# Patient Record
Sex: Female | Born: 1950 | Race: White | Hispanic: No | Marital: Married | Smoking: Never smoker
Health system: Southern US, Community
[De-identification: ages and names within clinical notes are randomized; demographics above are authoritative.]

## PROBLEM LIST (undated history)

## (undated) DIAGNOSIS — G5620 Lesion of ulnar nerve, unspecified upper limb: Secondary | ICD-10-CM

## (undated) DIAGNOSIS — J302 Other seasonal allergic rhinitis: Secondary | ICD-10-CM

## (undated) DIAGNOSIS — F419 Anxiety disorder, unspecified: Secondary | ICD-10-CM

## (undated) DIAGNOSIS — Q433 Congenital malformations of intestinal fixation: Secondary | ICD-10-CM

## (undated) DIAGNOSIS — R001 Bradycardia, unspecified: Secondary | ICD-10-CM

## (undated) DIAGNOSIS — M1990 Unspecified osteoarthritis, unspecified site: Secondary | ICD-10-CM

## (undated) DIAGNOSIS — K5790 Diverticulosis of intestine, part unspecified, without perforation or abscess without bleeding: Secondary | ICD-10-CM

## (undated) DIAGNOSIS — K5792 Diverticulitis of intestine, part unspecified, without perforation or abscess without bleeding: Secondary | ICD-10-CM

## (undated) DIAGNOSIS — R002 Palpitations: Secondary | ICD-10-CM

## (undated) DIAGNOSIS — B019 Varicella without complication: Secondary | ICD-10-CM

## (undated) DIAGNOSIS — E785 Hyperlipidemia, unspecified: Secondary | ICD-10-CM

## (undated) DIAGNOSIS — E079 Disorder of thyroid, unspecified: Secondary | ICD-10-CM

## (undated) DIAGNOSIS — I10 Essential (primary) hypertension: Secondary | ICD-10-CM

## (undated) DIAGNOSIS — M25512 Pain in left shoulder: Secondary | ICD-10-CM

## (undated) DIAGNOSIS — Z136 Encounter for screening for cardiovascular disorders: Secondary | ICD-10-CM

## (undated) DIAGNOSIS — J45909 Unspecified asthma, uncomplicated: Secondary | ICD-10-CM

## (undated) HISTORY — PX: KNEE SURGERY: SHX244

## (undated) HISTORY — PX: THYROIDECTOMY, PARTIAL: SHX18

## (undated) HISTORY — PX: OTHER SURGICAL HISTORY: SHX169

## (undated) HISTORY — DX: Varicella without complication: B01.9

## (undated) HISTORY — PX: WRIST SURGERY: SHX841

## (undated) HISTORY — DX: Anxiety disorder, unspecified: F41.9

## (undated) HISTORY — DX: Palpitations: R00.2

## (undated) HISTORY — PX: ABDOMINAL HYSTERECTOMY: SHX81

## (undated) HISTORY — PX: BREAST BIOPSY: SHX20

## (undated) HISTORY — PX: WISDOM TOOTH EXTRACTION: SHX21

## (undated) HISTORY — DX: Encounter for screening for cardiovascular disorders: Z13.6

## (undated) HISTORY — DX: Diverticulosis of intestine, part unspecified, without perforation or abscess without bleeding: K57.90

## (undated) HISTORY — DX: Other seasonal allergic rhinitis: J30.2

## (undated) HISTORY — DX: Diverticulitis of intestine, part unspecified, without perforation or abscess without bleeding: K57.92

## (undated) HISTORY — DX: Congenital malformations of intestinal fixation: Q43.3

## (undated) HISTORY — DX: Lesion of ulnar nerve, unspecified upper limb: G56.20

## (undated) HISTORY — PX: TONSILLECTOMY: SUR1361

## (undated) HISTORY — DX: Bradycardia, unspecified: R00.1

## (undated) HISTORY — PX: SHOULDER SURGERY: SHX246

---

## 2005-01-22 ENCOUNTER — Other Ambulatory Visit: Admission: RE | Admit: 2005-01-22 | Discharge: 2005-01-22 | Payer: Self-pay | Admitting: Obstetrics and Gynecology

## 2005-07-29 ENCOUNTER — Ambulatory Visit (HOSPITAL_BASED_OUTPATIENT_CLINIC_OR_DEPARTMENT_OTHER): Admission: RE | Admit: 2005-07-29 | Discharge: 2005-07-29 | Payer: Self-pay | Admitting: Orthopedic Surgery

## 2006-07-23 ENCOUNTER — Encounter: Admission: RE | Admit: 2006-07-23 | Discharge: 2006-07-23 | Payer: Self-pay | Admitting: Family Medicine

## 2012-03-31 DIAGNOSIS — Q433 Congenital malformations of intestinal fixation: Secondary | ICD-10-CM

## 2012-03-31 HISTORY — DX: Congenital malformations of intestinal fixation: Q43.3

## 2012-10-28 LAB — HM COLONOSCOPY

## 2012-12-09 ENCOUNTER — Encounter (HOSPITAL_BASED_OUTPATIENT_CLINIC_OR_DEPARTMENT_OTHER): Payer: Self-pay

## 2012-12-09 ENCOUNTER — Emergency Department (HOSPITAL_BASED_OUTPATIENT_CLINIC_OR_DEPARTMENT_OTHER): Payer: 59

## 2012-12-09 ENCOUNTER — Emergency Department (HOSPITAL_BASED_OUTPATIENT_CLINIC_OR_DEPARTMENT_OTHER)
Admission: EM | Admit: 2012-12-09 | Discharge: 2012-12-09 | Disposition: A | Discharge status: Home / Self Care | Payer: 59 | Attending: Emergency Medicine | Admitting: Emergency Medicine

## 2012-12-09 DIAGNOSIS — R0789 Other chest pain: Secondary | ICD-10-CM | POA: Insufficient documentation

## 2012-12-09 DIAGNOSIS — F411 Generalized anxiety disorder: Secondary | ICD-10-CM | POA: Insufficient documentation

## 2012-12-09 DIAGNOSIS — E785 Hyperlipidemia, unspecified: Secondary | ICD-10-CM | POA: Insufficient documentation

## 2012-12-09 DIAGNOSIS — R002 Palpitations: Secondary | ICD-10-CM | POA: Insufficient documentation

## 2012-12-09 DIAGNOSIS — Z79899 Other long term (current) drug therapy: Secondary | ICD-10-CM | POA: Insufficient documentation

## 2012-12-09 DIAGNOSIS — E039 Hypothyroidism, unspecified: Secondary | ICD-10-CM | POA: Insufficient documentation

## 2012-12-09 HISTORY — DX: Congenital malformations of intestinal fixation: Q43.3

## 2012-12-09 HISTORY — DX: Hyperlipidemia, unspecified: E78.5

## 2012-12-09 HISTORY — DX: Disorder of thyroid, unspecified: E07.9

## 2012-12-09 LAB — COMPREHENSIVE METABOLIC PANEL
AST: 24 U/L (ref 0–37)
BUN: 15 mg/dL (ref 6–23)
CO2: 25 mEq/L (ref 19–32)
Calcium: 9.9 mg/dL (ref 8.4–10.5)
Chloride: 105 mEq/L (ref 96–112)
Creatinine, Ser: 0.9 mg/dL (ref 0.50–1.10)
GFR calc Af Amer: 79 mL/min — ABNORMAL LOW (ref >90)
GFR calc non Af Amer: 68 mL/min — ABNORMAL LOW (ref >90)
Glucose, Bld: 103 mg/dL — ABNORMAL HIGH (ref 70–99)
Total Bilirubin: 1.1 mg/dL (ref 0.3–1.2)

## 2012-12-09 LAB — CBC WITH DIFFERENTIAL/PLATELET
Basophils Absolute: 0 10*3/uL (ref 0.0–0.1)
Eosinophils Relative: 1 % (ref 0–5)
HCT: 43.4 % (ref 36.0–46.0)
Hemoglobin: 14.9 g/dL (ref 12.0–15.0)
Lymphocytes Relative: 23 % (ref 12–46)
Lymphs Abs: 1.5 10*3/uL (ref 0.7–4.0)
MCV: 87.9 fL (ref 78.0–100.0)
Monocytes Absolute: 0.5 10*3/uL (ref 0.1–1.0)
Monocytes Relative: 9 % (ref 3–12)
Neutro Abs: 4.1 10*3/uL (ref 1.7–7.7)
RBC: 4.94 MIL/uL (ref 3.87–5.11)
RDW: 13.4 % (ref 11.5–15.5)
WBC: 6.2 10*3/uL (ref 4.0–10.5)

## 2012-12-09 LAB — TROPONIN I: Troponin I: <0.3 ng/mL (ref <0.30)

## 2012-12-09 MED ORDER — ASPIRIN 325 MG PO TABS
325.0000 mg | ORAL_TABLET | Freq: Once | ORAL | Status: DC
  Filled 2012-12-09: qty 1

## 2012-12-09 MED ORDER — ASPIRIN 81 MG PO CHEW
CHEWABLE_TABLET | ORAL | Status: AC
  Administered 2012-12-09: 81 mg
  Filled 2012-12-09: qty 2

## 2012-12-09 NOTE — ED Notes (Signed)
Patient transported to X-ray 

## 2012-12-09 NOTE — ED Notes (Signed)
Sent to ED for evaluation of anxiety, palpitations, and tachycardic.

## 2012-12-09 NOTE — ED Notes (Signed)
Pt took 164mg  Aspirin PTA

## 2012-12-09 NOTE — ED Provider Notes (Signed)
CSN: [id number]     Arrival date & time 12/09/12  1258 History   First MD Initiated Contact with Patient 12/09/12 1304     Chief Complaint  Patient presents with  . Anxiety  . Palpitations   (Consider location/radiation/quality/duration/timing/severity/associated sxs/prior Treatment) The history is provided by the patient.  Courtney Valenzuela is a 62 y.o. female issue hypothyroidism, hyperlipidemia here presenting with chest pain and palpitations. Start with palpitations while at work around 10:30 AM this morning. Denies being more anxious but she's been very stressed out recently at work and with her personal life. Some chest pressure since then. She then left work and went to her doctor's office and was found to be in sinus tachycardia 110. Does not have a history of cardiac disease but her dad had cardiac stents. Never smoker. Still has some chest pressure.    Past Medical History  Diagnosis Date  . Thyroid disease   . Hyperlipidemia   . Malrotation of colon    Past Surgical History  Procedure Laterality Date  . Tonsillectomy    . Abdominal hysterectomy    . Shoulder surgery    . Thyroidectomy, partial    . Laproscopy    . Wrist surgery     No family history on file. History  Substance Use Topics  . Smoking status: Never Smoker   . Smokeless tobacco: Not on file  . Alcohol Use: Yes     Comment: 1/2 bottle wine   OB History   Grav Para Term Preterm Abortions TAB SAB Ect Mult Living                 Review of Systems  Cardiovascular: Positive for chest pain and palpitations.  All other systems reviewed and are negative.    Allergies  Augmentin and Erythromycin  Home Medications   Current Outpatient Rx  Name  Route  Sig  Dispense  Refill  . levothyroxine (SYNTHROID, LEVOTHROID) 75 MCG tablet   Oral   Take 75 mcg by mouth daily before breakfast.         . loratadine (CLARITIN) 10 MG tablet   Oral   Take 10 mg by mouth daily.         . niacin-simvastatin  (SIMCOR) 1000-20 MG 24 hr tablet   Oral   Take 1 tablet by mouth at bedtime.          BP 147/93  Pulse 84  Temp(Src) 98.4 F (36.9 C) (Oral)  Resp 20  Ht 5\' 6"  (1.676 m)  Wt 210 lb (95.255 kg)  BMI 33.91 kg/m2  SpO2 100% Physical Exam  Nursing note and vitals reviewed. Constitutional: She is oriented to person, place, and time. She appears well-developed and well-nourished.  HENT:  Head: Normocephalic.  Mouth/Throat: Oropharynx is clear and moist.  Eyes: Conjunctivae are normal. Pupils are equal, round, and reactive to light.  Neck: Normal range of motion. Neck supple.  Cardiovascular: Normal rate, regular rhythm and normal heart sounds.   Pulmonary/Chest: Effort normal and breath sounds normal. No respiratory distress. She has no wheezes. She has no rales. She exhibits no tenderness.  Abdominal: Soft. Bowel sounds are normal. She exhibits no distension. There is no tenderness. There is no rebound and no guarding.  Musculoskeletal: Normal range of motion. She exhibits no edema and no tenderness.  Neurological: She is alert and oriented to person, place, and time.  Skin: Skin is warm and dry.  Psychiatric: She has a normal mood and affect. Her behavior  is normal. Judgment and thought content normal.    ED Course  Procedures (including critical care time) Labs Review Labs Reviewed  COMPREHENSIVE METABOLIC PANEL - Abnormal; Notable for the following:    Glucose, Bld 103 (*)    GFR calc non Af Amer 68 (*)    GFR calc Af Amer 79 (*)    All other components within normal limits  CBC WITH DIFFERENTIAL  TROPONIN I   Imaging Review Dg Chest 2 View  12/09/2012   *RADIOLOGY REPORT*  Clinical Data: Chest pain and tachycardia  CHEST - 2 VIEW  Comparison: None.  Findings: Lungs clear.  Heart size and pulmonary vascularity are normal.  No adenopathy.  No bone lesions.  There is postoperative change in the right neck region.  IMPRESSION: No edema or consolidation.   Original Report  Authenticated By: Bretta Bang, M.D.     Date: 12/09/2012  Rate: 84  Rhythm: normal sinus rhythm  QRS Axis: normal  Intervals: normal  ST/T Wave abnormalities: nonspecific ST changes  Conduction Disutrbances:none  Narrative Interpretation:   Old EKG Reviewed: unchanged    MDM  No diagnosis found. Courtney Valenzuela is a 62 y.o. female here with chest pain, palpitations. Not tachy on arrival but some chest pressure. Will need to r/o ACS with trop x 2. Will reassess.   3 PM Trop neg x 1. I signed out to Dr. Ranae Palms to repeat troponin and reassess patient.     Richardean Canal, MD 12/09/12 1754

## 2012-12-09 NOTE — ED Notes (Signed)
Pt developed palpitations while at work, drove to [employer] office and sent to ED for evaluation of chest discomfort and tachycardia.

## 2012-12-09 NOTE — ED Provider Notes (Signed)
Patient signed out from previous EDP pending repeat troponin. Troponin was normal. Patient remained asymptomatic in the emergency department. Vital signs remained stable. Patient advised to followup with cardiology for evaluation of her palpitations. Return precautions have been given.  Loren Racer, MD 12/09/12 1745

## 2012-12-09 NOTE — ED Notes (Signed)
Pt returned from radiology.

## 2012-12-20 ENCOUNTER — Encounter: Payer: Self-pay | Admitting: *Deleted

## 2012-12-27 ENCOUNTER — Encounter: Payer: Self-pay | Admitting: Cardiology

## 2012-12-27 ENCOUNTER — Ambulatory Visit (INDEPENDENT_AMBULATORY_CARE_PROVIDER_SITE_OTHER): Payer: 59 | Admitting: Cardiology

## 2012-12-27 VITALS — BP 128/86 | HR 76 | Wt 211.0 lb

## 2012-12-27 DIAGNOSIS — R002 Palpitations: Secondary | ICD-10-CM

## 2012-12-27 DIAGNOSIS — R9431 Abnormal electrocardiogram [ECG] [EKG]: Secondary | ICD-10-CM

## 2012-12-27 DIAGNOSIS — E7800 Pure hypercholesterolemia, unspecified: Secondary | ICD-10-CM

## 2012-12-27 NOTE — Patient Instructions (Addendum)
Your physician has recommended that you wear a 48 hour holter monitor. Holter monitors are medical devices that record the heart's electrical activity. Doctors most often use these monitors to diagnose arrhythmias. Arrhythmias are problems with the speed or rhythm of the heartbeat. The monitor is a small, portable device. You can wear one while you do your normal daily activities. This is usually used to diagnose what is causing palpitations/syncope (passing out).  Your physician recommends that you return for a FASTING LIPID and TSH--nothing to eat or drink after midnight, lab opens at 7:30 am  Your physician recommends that you schedule a follow-up appointment in: 2-3 WEEKS with Dr Delton See  Your physician has requested that you have an echocardiogram. Echocardiography is a painless test that uses sound waves to create images of your heart. It provides your doctor with information about the size and shape of your heart and how well your heart's chambers and valves are working. This procedure takes approximately one hour. There are no restrictions for this procedure.

## 2012-12-27 NOTE — Progress Notes (Signed)
Patient ID: Adaleigh Warf, female   DOB: [birth year], 62 y.o.   MRN: [id number]    Patient Name: Courtney Valenzuela Date of Encounter: 12/27/2012  Primary Care Provider:  No primary provider on file. Primary Cardiologist:  Tobias Alexander, H  Patient Profile  Palpitations  Problem List   Past Medical History  Diagnosis Date  . Thyroid disease   . Hyperlipidemia   . Malrotation of colon   . Anxiety   . Palpitations    Past Surgical History  Procedure Laterality Date  . Tonsillectomy    . Abdominal hysterectomy    . Shoulder surgery    . Thyroidectomy, partial    . Laproscopy    . Wrist surgery      Allergies  Allergies  Allergen Reactions  . Augmentin [Amoxicillin-Pot Clavulanate]     "gastric issues"  . Erythromycin     "Gastric issues"    HPI  62 year old female with h/o hypothyroidism, hyperlipidemia who went to the ER for sudden onset palpitations while at work. They lasted for about 15 minutes before she went to the ER. She was found to be in sinus tachycardia with possible left atrial enlargement on the ECG. She was ruled out for ACS. She was so anxious at the time that she doesn't remember if she had any associated SOB or chest pain. The tachycardia slowly resolved. The patient is otherwise physically active, she describes a walk of 40 blocks the last weekend without any chest pain or SOB. The patient was followed in [facility] by a cardiologist as a part of a study with hereditary hyperlipidemia. Her father has severe hypercholesterolemia and CAD with 3 stents placements in the past.  She had a negative stress test in 2011, negative cath in 2004 and was treated for hypercholesterolemia by Simvastatin/niacin combination. She doesn't tolerate hot flushes associated with Niacin very well.  Home Medications  Prior to Admission medications   Medication Sig Start Date End Date Taking? Authorizing Provider  levothyroxine (SYNTHROID, LEVOTHROID) 75 MCG tablet Take 75 mcg  by mouth daily before breakfast.   Yes Historical Provider, MD  niacin-simvastatin (SIMCOR) 1000-20 MG 24 hr tablet Take 1 tablet by mouth at bedtime.   Yes Historical Provider, MD   Family History  Family History  Problem Relation Age of Onset  . Heart Problems Father     cardiac stents   Social History   Review of Systems General:  No chills, fever, night sweats or weight changes.  Cardiovascular:  No chest pain, dyspnea on exertion, edema, orthopnea, + palpitations, paroxysmal nocturnal dyspnea. Dermatological: No rash, lesions/masses Respiratory: No cough, dyspnea Urologic: No hematuria, dysuria Abdominal:   No nausea, vomiting, diarrhea, bright red blood per rectum, melena, or hematemesis Neurologic:  No visual changes, wkns, changes in mental status. All other systems reviewed and are otherwise negative except as noted above.  Physical Exam  Blood pressure 128/86, pulse 76, weight 211 lb (95.709 kg).  General: Pleasant, NAD Psych: Normal affect. Neuro: Alert and oriented X 3. Moves all extremities spontaneously. HEENT: Normal  Neck: Supple without bruits or JVD. Lungs:  Resp regular and unlabored, CTA. Heart: RRR no s3, s4, or murmurs. Abdomen: Soft, non-tender, non-distended, BS + x 4.  Extremities: No clubbing, cyanosis or edema. DP/PT/Radials 2+ and equal bilaterally.  Accessory Clinical Findings  ECG - SR, 76 BPM, normal ECG  Assessment & Plan  62 year old female   1. Palpitations - 1 symptomatic episode, we will order a 48  H holter to see if there are any asymptomatic episodes of possible a-fib as there is suggestion of enlarged left atrium on the ECG. We will order echocardiogram as well.  2. Hypothyroidism - on Synthroid, we will check TSH   3. Hyperlipidemia - recheck today  Follow up in 3 weeks  Lars Masson, MD 12/27/2012, 3:44 PM

## 2013-01-10 ENCOUNTER — Encounter: Payer: Self-pay | Admitting: *Deleted

## 2013-01-10 ENCOUNTER — Encounter (INDEPENDENT_AMBULATORY_CARE_PROVIDER_SITE_OTHER): Payer: 59

## 2013-01-10 ENCOUNTER — Other Ambulatory Visit (INDEPENDENT_AMBULATORY_CARE_PROVIDER_SITE_OTHER): Payer: 59

## 2013-01-10 DIAGNOSIS — E7800 Pure hypercholesterolemia, unspecified: Secondary | ICD-10-CM

## 2013-01-10 DIAGNOSIS — R002 Palpitations: Secondary | ICD-10-CM

## 2013-01-10 LAB — TSH: TSH: 0.86 u[IU]/mL (ref 0.35–5.50)

## 2013-01-10 LAB — LIPID PANEL
Cholesterol: 153 mg/dL (ref 0–200)
HDL: 46.5 mg/dL (ref >39.00)
LDL Cholesterol: 86 mg/dL (ref 0–99)
Total CHOL/HDL Ratio: 3
Triglycerides: 103 mg/dL (ref 0.0–149.0)
VLDL: 20.6 mg/dL (ref 0.0–40.0)

## 2013-01-10 NOTE — Progress Notes (Signed)
Patient ID: Courtney Valenzuela, female   DOB: Aug 06, 1950, 62 y.o.   MRN: [id number] E-Cardio 48 hour holter monitor applied to patient.

## 2013-01-12 ENCOUNTER — Ambulatory Visit (HOSPITAL_COMMUNITY): Payer: 59 | Attending: Cardiology | Admitting: Cardiology

## 2013-01-12 ENCOUNTER — Other Ambulatory Visit (HOSPITAL_COMMUNITY): Payer: Self-pay | Admitting: Cardiology

## 2013-01-12 DIAGNOSIS — R9431 Abnormal electrocardiogram [ECG] [EKG]: Secondary | ICD-10-CM | POA: Insufficient documentation

## 2013-01-12 DIAGNOSIS — R002 Palpitations: Secondary | ICD-10-CM

## 2013-01-12 DIAGNOSIS — E785 Hyperlipidemia, unspecified: Secondary | ICD-10-CM | POA: Insufficient documentation

## 2013-01-12 DIAGNOSIS — E7800 Pure hypercholesterolemia, unspecified: Secondary | ICD-10-CM

## 2013-01-12 NOTE — Progress Notes (Signed)
Echo performed. 

## 2013-01-18 ENCOUNTER — Encounter (INDEPENDENT_AMBULATORY_CARE_PROVIDER_SITE_OTHER): Payer: Self-pay

## 2013-01-18 ENCOUNTER — Encounter: Payer: Self-pay | Admitting: Cardiology

## 2013-01-18 ENCOUNTER — Ambulatory Visit (INDEPENDENT_AMBULATORY_CARE_PROVIDER_SITE_OTHER): Payer: 59 | Admitting: Cardiology

## 2013-01-18 VITALS — BP 130/78 | HR 73 | Ht 66.0 in | Wt 210.4 lb

## 2013-01-18 DIAGNOSIS — E7800 Pure hypercholesterolemia, unspecified: Secondary | ICD-10-CM

## 2013-01-18 MED ORDER — SIMVASTATIN 20 MG PO TABS: 20.0000 mg | ORAL_TABLET | Freq: Every day | ORAL | Status: DC

## 2013-01-18 MED ORDER — METOPROLOL TARTRATE 25 MG PO TABS: ORAL_TABLET | ORAL | Status: DC

## 2013-01-18 NOTE — Progress Notes (Signed)
Patient ID: Courtney Valenzuela, female   DOB: 02-Apr-1950, 62 y.o.   MRN: [id number]     Patient Name: Courtney Valenzuela Date of Encounter: 01/18/2013  Primary Care Provider:  No primary provider on file. Primary Cardiologist:  Tobias Alexander, H  Patient Profile  Palpitations  Problem List   Past Medical History  Diagnosis Date  . Thyroid disease   . Hyperlipidemia   . Malrotation of colon   . Anxiety   . Palpitations    Past Surgical History  Procedure Laterality Date  . Tonsillectomy    . Abdominal hysterectomy    . Shoulder surgery    . Thyroidectomy, partial    . Laproscopy    . Wrist surgery      Allergies  Allergies  Allergen Reactions  . Augmentin [Amoxicillin-Pot Clavulanate]     "gastric issues"  . Erythromycin     "Gastric issues"    HPI  62 year old female with h/o hypothyroidism, hyperlipidemia who went to the ER for sudden onset palpitations while at work. They lasted for about 15 minutes before she went to the ER. She was found to be in sinus tachycardia with possible left atrial enlargement on the ECG. She was ruled out for ACS. She was so anxious at the time that she doesn't remember if she had any associated SOB or chest pain. The tachycardia slowly resolved. The patient is otherwise physically active, she describes a walk of 40 blocks the last weekend without any chest pain or SOB. The patient was followed in [facility] by a cardiologist as a part of a study with hereditary hyperlipidemia. Her father has severe hypercholesterolemia and CAD with 3 stents placements in the past.  She had a negative stress test in 2011, negative cath in 2004 and was treated for hypercholesterolemia by Simvastatin/niacin combination. She doesn't tolerate hot flushes associated with Niacin very well.  Follow up after Holter monitor, she felt 1 episode of heart flutering for a few seconds during the recording.  Home Medications  Prior to Admission medications   Medication  Sig Start Date End Date Taking? Authorizing Provider  levothyroxine (SYNTHROID, LEVOTHROID) 75 MCG tablet Take 75 mcg by mouth daily before breakfast.   Yes Historical Provider, MD  niacin-simvastatin (SIMCOR) 1000-20 MG 24 hr tablet Take 1 tablet by mouth at bedtime.   Yes Historical Provider, MD   Family History  Family History  Problem Relation Age of Onset  . Heart Problems Father     cardiac stents   Social History   Review of Systems General:  No chills, fever, night sweats or weight changes.  Cardiovascular:  No chest pain, dyspnea on exertion, edema, orthopnea, + palpitations, paroxysmal nocturnal dyspnea. Dermatological: No rash, lesions/masses Respiratory: No cough, dyspnea Urologic: No hematuria, dysuria Abdominal:   No nausea, vomiting, diarrhea, bright red blood per rectum, melena, or hematemesis Neurologic:  No visual changes, wkns, changes in mental status. All other systems reviewed and are otherwise negative except as noted above.  Physical Exam  Height 5\' 6"  (1.676 m).  General: Pleasant, NAD Psych: Normal affect. Neuro: Alert and oriented X 3. Moves all extremities spontaneously. HEENT: Normal  Neck: Supple without bruits or JVD. Lungs:  Resp regular and unlabored, CTA. Heart: RRR no s3, s4, or murmurs. Abdomen: Soft, non-tender, non-distended, BS + x 4.  Extremities: No clubbing, cyanosis or edema. DP/PT/Radials 2+ and equal bilaterally.  Accessory Clinical Findings  ECG - SR, 76 BPM, normal ECG  Echo: 01/12/2013 ------------------------------------------------------------ Study Conclusions  -  Left ventricle: The cavity size was normal. Wall thickness was normal. Systolic function was normal. The estimated ejection fraction was in the range of 60% to 65%. Wall motion was normal; there were no regional wall motion abnormalities. Features are consistent with a pseudonormal left ventricular filling pattern, with concomitant abnormal relaxation and  increased filling pressure (grade 2 diastolic dysfunction). - Aortic valve: There was no stenosis. - Mitral valve: No significant regurgitation. - Left atrium: The atrium was moderately dilated. - Right ventricle: The cavity size was normal. Systolic function was normal. - Pulmonary arteries: No complete TR doppler jet so unable to estimate PA systolic pressure. - Inferior vena cava: The vessel was normal in size; the respirophasic diameter changes were in the normal range (= 50%); findings are consistent with normal central venous pressure. Impressions:  - Normal LV size and systolic function, EF 60-65%. Normal RV size and systolic function. No significant valvular abnormalities.   Assessment & Plan  62 year old female   1. Palpitations - there is only 1 3 beat SVT during 48Hour Holter, echo shows normal LV EF but peudonormal pattern, moderately enlarged LA. We will start the patient on Metoprolol 12.5 mg PO BID.  2. Hypothyroidism - on Synthroid, TSH normal  3. Hyperlipidemia - well controlled, side effects of niacin, we wil d/c and continue Simvastatin only. The patient is encouraged to exercise.  Follow up in 6 months with lipid profile and liver enzymes.  Tobias Alexander, Rexene Edison, MD 01/18/2013, 9:27 AM

## 2013-01-18 NOTE — Patient Instructions (Signed)
Stop Simcor   Start Zocor 20 mg after evening meal   Start Metoprolol 25 mg take 1/2 tablet twice a day   Your physician wants you to follow-up in: 6 months with fasting lab work. You will receive a reminder letter in the mail two months in advance. If you don't receive a letter, please call our office to schedule the follow-up appointment.

## 2013-01-18 NOTE — Addendum Note (Signed)
Addended by: Meda Klinefelter D on: 01/18/2013 09:55 AM   Modules accepted: Orders

## 2013-01-20 ENCOUNTER — Telehealth: Payer: Self-pay

## 2013-01-20 NOTE — Telephone Encounter (Signed)
Patient called Dr.Nelson reviewed 24 hour holter monitor which was normal.Advised to schedule appointment with Dr.Nelson in 06/2013.

## 2013-02-18 ENCOUNTER — Ambulatory Visit (INDEPENDENT_AMBULATORY_CARE_PROVIDER_SITE_OTHER): Payer: 59 | Admitting: Physician Assistant

## 2013-02-18 ENCOUNTER — Encounter: Payer: Self-pay | Admitting: Physician Assistant

## 2013-02-18 VITALS — BP 124/78 | HR 60 | Temp 97.4°F | Resp 16 | Ht 66.0 in | Wt 211.8 lb

## 2013-02-18 DIAGNOSIS — Z0000 Encounter for general adult medical examination without abnormal findings: Secondary | ICD-10-CM

## 2013-02-18 DIAGNOSIS — Z23 Encounter for immunization: Secondary | ICD-10-CM

## 2013-02-18 DIAGNOSIS — E039 Hypothyroidism, unspecified: Secondary | ICD-10-CM

## 2013-02-18 DIAGNOSIS — R002 Palpitations: Secondary | ICD-10-CM

## 2013-02-18 DIAGNOSIS — E785 Hyperlipidemia, unspecified: Secondary | ICD-10-CM

## 2013-02-18 LAB — CBC WITH DIFFERENTIAL/PLATELET
Basophils Absolute: 0.1 10*3/uL (ref 0.0–0.1)
HCT: 41.7 % (ref 36.0–46.0)
Lymphocytes Relative: 34 % (ref 12–46)
Lymphs Abs: 1.8 10*3/uL (ref 0.7–4.0)
Neutro Abs: 2.9 10*3/uL (ref 1.7–7.7)
Platelets: 196 10*3/uL (ref 150–400)
RBC: 4.77 MIL/uL (ref 3.87–5.11)
RDW: 13.5 % (ref 11.5–15.5)
WBC: 5.4 10*3/uL (ref 4.0–10.5)

## 2013-02-18 LAB — COMPREHENSIVE METABOLIC PANEL
ALT: 21 U/L (ref 0–35)
AST: 19 U/L (ref 0–37)
Albumin: 3.9 g/dL (ref 3.5–5.2)
CO2: 28 mEq/L (ref 19–32)
Calcium: 9.2 mg/dL (ref 8.4–10.5)
Chloride: 106 mEq/L (ref 96–112)
Potassium: 4.5 mEq/L (ref 3.5–5.3)
Total Protein: 6.4 g/dL (ref 6.0–8.3)

## 2013-02-18 LAB — LIPID PANEL
Cholesterol: 160 mg/dL (ref 0–200)
LDL Cholesterol: 88 mg/dL (ref 0–99)
Total CHOL/HDL Ratio: 3.7 Ratio
VLDL: 29 mg/dL (ref 0–40)

## 2013-02-18 LAB — TSH: TSH: 1.336 u[IU]/mL (ref 0.350–4.500)

## 2013-02-18 MED ORDER — LEVOTHYROXINE SODIUM 75 MCG PO TABS: 75.0000 ug | ORAL_TABLET | Freq: Every day | ORAL | Status: DC

## 2013-02-18 NOTE — Patient Instructions (Signed)
Please obtain labs.  I will call you with your results.  We will switch back to branded Synthroid at your current doe of 75 mcg daily.  Will need you to return in 1 month for thyroid recheck and to assess medication effectiveness.  Please return sooner if needed.

## 2013-02-18 NOTE — Progress Notes (Signed)
Pre-visit discussion using our clinic review tool. No additional management support is needed unless otherwise documented below in the visit note./SLS 

## 2013-02-18 NOTE — Progress Notes (Signed)
Patient ID: Courtney Valenzuela, female   DOB: Jul 11, 1950, 62 y.o.   MRN: [id number]  Patient presents to clinic today to establish care.  Acute Concerns: No current concerns  Chronic Issues: (1) Hyperlipidemia -- Patient currently on zocor 20 mg tablet daily.  Is fasting for lipid panel today.  Patient is a non-smoker.  (2) Hypothyroidism -- Patient reports partial thyroidectomy.  Currently on levothyroxine 75 mcg daily.  Was previously on branded Synthroid.  States her pharmacy and insurance want her back on the branded medication.  Is due for TSH recheck.  States thyroid has been well-controlled on current dosage.  (3) Palpitations -- Patient reports history of palpitations.  Currently on lopressor 25 mg daily with adequate control of symptoms.  Denies palpitations, shortness of breath, chest pain, headache, vision changes.    Health Maintenance: Dental -- UTD Vision -- UTD Immunizations -- needs tetanus shot.  Otherwise UTD PAP -- s/p hysterectomy Colonoscopy -- 2014 - polyp; due in 2019 Mammogram -- 2013 - no abnormal findings.  Past Medical History  Diagnosis Date  . Thyroid disease   . Hyperlipidemia   . Malrotation of colon   . Anxiety   . Palpitations   . Chicken pox     Current Outpatient Prescriptions on File Prior to Visit  Medication Sig Dispense Refill  . metoprolol tartrate (LOPRESSOR) 25 MG tablet Take 1/2 tablet twice a day  90 tablet  3  . simvastatin (ZOCOR) 20 MG tablet Take 1 tablet (20 mg total) by mouth at bedtime.  90 tablet  3   No current facility-administered medications on file prior to visit.    Allergies  Allergen Reactions  . Augmentin [Amoxicillin-Pot Clavulanate]     "gastric issues"  . Erythromycin     "Gastric issues"    Family History  Problem Relation Age of Onset  . Heart Problems Father     cardiac stents  . Hyperlipidemia Father   . Heart Problems Mother     Palpitations  . Transient ischemic attack Mother   . Diabetes Brother    . Arthritis-Osteo Father   . Arthritis-Osteo Mother   . Arthritis Sister   . Colon cancer Maternal Grandmother   . Heart disease Paternal Grandmother   . Heart disease Paternal Grandfather   . Memory loss Mother   . Gallbladder disease Brother     History   Social History  . Marital Status: Married    Spouse Name: N/A    Number of Children: N/A  . Years of Education: N/A   Social History Main Topics  . Smoking status: Never Smoker   . Smokeless tobacco: Never Used  . Alcohol Use: Yes     Comment: 1/2 bottle wine  . Drug Use: No  . Sexual Activity: None   Other Topics Concern  . None   Social History Narrative  . None   Review of Systems  Constitutional: Negative for fever, chills, weight loss and malaise/fatigue.  HENT: Negative for ear discharge, ear pain, hearing loss and tinnitus.   Eyes: Negative for blurred vision, double vision, photophobia and pain.  Respiratory: Negative for cough, shortness of breath and wheezing.   Cardiovascular: Negative for chest pain and palpitations.  Gastrointestinal: Negative for heartburn, nausea, vomiting, abdominal pain, diarrhea, constipation, blood in stool and melena.  Genitourinary: Negative for dysuria, urgency, frequency, hematuria and flank pain.  Neurological: Negative for dizziness, seizures, loss of consciousness and headaches.  Psychiatric/Behavioral: Negative for depression, suicidal ideas, hallucinations and substance abuse.  The patient is nervous/anxious. The patient does not have insomnia.    Filed Vitals:   02/18/13 0730  BP: 124/78  Pulse: 60  Temp: 97.4 F (36.3 C)  Resp: 16   Physical Exam  Vitals reviewed. Constitutional: She is oriented to person, place, and time and well-developed, well-nourished, and in no distress.  HENT:  Head: Normocephalic and atraumatic.  Right Ear: External ear normal.  Left Ear: External ear normal.  Nose: Nose normal.  Mouth/Throat: Oropharynx is clear and moist. No  oropharyngeal exudate.  Tympanic membranes within normal limits bilaterally.  Eyes: Conjunctivae and EOM are normal. Pupils are equal, round, and reactive to light.  Neck: Neck supple.  Cardiovascular: Normal rate, regular rhythm, normal heart sounds and intact distal pulses.   Pulmonary/Chest: Effort normal and breath sounds normal. No respiratory distress. She has no wheezes. She has no rales. She exhibits no tenderness.  Abdominal: Soft. Bowel sounds are normal. She exhibits no distension and no mass. There is no tenderness. There is no rebound and no guarding.  Musculoskeletal: Normal range of motion.  Lymphadenopathy:    She has no cervical adenopathy.  Neurological: She is alert and oriented to person, place, and time. No cranial nerve deficit.  Skin: Skin is warm and dry. No rash noted.  Psychiatric: Affect normal.     Recent Results (from the past 2160 hour(s))  CBC WITH DIFFERENTIAL     Status: None   Collection Time    12/09/12  1:30 PM      Result Value Range   WBC 6.2  4.0 - 10.5 K/uL   RBC 4.94  3.87 - 5.11 MIL/uL   Hemoglobin 14.9  12.0 - 15.0 g/dL   HCT 57.8  46.9 - 62.9 %   MCV 87.9  78.0 - 100.0 fL   MCH 30.2  26.0 - 34.0 pg   MCHC 34.3  30.0 - 36.0 g/dL   RDW 52.8  41.3 - 24.4 %   Platelets 187  150 - 400 K/uL   Neutrophils Relative % 67  43 - 77 %   Neutro Abs 4.1  1.7 - 7.7 K/uL   Lymphocytes Relative 23  12 - 46 %   Lymphs Abs 1.5  0.7 - 4.0 K/uL   Monocytes Relative 9  3 - 12 %   Monocytes Absolute 0.5  0.1 - 1.0 K/uL   Eosinophils Relative 1  0 - 5 %   Eosinophils Absolute 0.1  0.0 - 0.7 K/uL   Basophils Relative 1  0 - 1 %   Basophils Absolute 0.0  0.0 - 0.1 K/uL  COMPREHENSIVE METABOLIC PANEL     Status: Abnormal   Collection Time    12/09/12  1:30 PM      Result Value Range   Sodium 139  135 - 145 mEq/L   Potassium 3.6  3.5 - 5.1 mEq/L   Chloride 105  96 - 112 mEq/L   CO2 25  19 - 32 mEq/L   Glucose, Bld 103 (*) 70 - 99 mg/dL   BUN 15  6 - 23  mg/dL   Creatinine, Ser 0.10  0.50 - 1.10 mg/dL   Calcium 9.9  8.4 - 27.2 mg/dL   Total Protein 7.1  6.0 - 8.3 g/dL   Albumin 4.1  3.5 - 5.2 g/dL   AST 24  0 - 37 U/L   ALT 26  0 - 35 U/L   Alkaline Phosphatase 76  39 - 117 U/L  Total Bilirubin 1.1  0.3 - 1.2 mg/dL   GFR calc non Af Amer 68 (*) >90 mL/min   GFR calc Af Amer 79 (*) >90 mL/min   Comment: (NOTE)     The eGFR has been calculated using the CKD EPI equation.     This calculation has not been validated in all clinical situations.     eGFR's persistently <90 mL/min signify possible Chronic Kidney     Disease.  TROPONIN I     Status: None   Collection Time    12/09/12  1:30 PM      Result Value Range   Troponin I <0.30  <0.30 ng/mL   Comment:            Due to the release kinetics of cTnI,     a negative result within the first hours     of the onset of symptoms does not rule out     myocardial infarction with certainty.     If myocardial infarction is still suspected,     repeat the test at appropriate intervals.  TROPONIN I     Status: None   Collection Time    12/09/12  4:36 PM      Result Value Range   Troponin I <0.30  <0.30 ng/mL   Comment:            Due to the release kinetics of cTnI,     a negative result within the first hours     of the onset of symptoms does not rule out     myocardial infarction with certainty.     If myocardial infarction is still suspected,     repeat the test at appropriate intervals.  TSH     Status: None   Collection Time    01/10/13  8:12 AM      Result Value Range   TSH 0.86  0.35 - 5.50 uIU/mL  LIPID PANEL     Status: None   Collection Time    01/10/13  8:12 AM      Result Value Range   Cholesterol 153  0 - 200 mg/dL   Comment: ATP III Classification       Desirable:  < 200 mg/dL               Borderline High:  200 - 239 mg/dL          High:  > = 045 mg/dL   Triglycerides 409.8  0.0 - 149.0 mg/dL   Comment: Normal:  <119 mg/dLBorderline High:  150 - 199 mg/dL   HDL  14.78  >29.56 mg/dL   VLDL 21.3  0.0 - 08.6 mg/dL   LDL Cholesterol 86  0 - 99 mg/dL   Total CHOL/HDL Ratio 3     Comment:                Men          Women1/2 Average Risk     3.4          3.3Average Risk          5.0          4.42X Average Risk          9.6          7.13X Average Risk          15.0          11.0  CBC WITH DIFFERENTIAL     Status: None   Collection Time    02/18/13  8:34 AM      Result Value Range   WBC 5.4  4.0 - 10.5 K/uL   RBC 4.77  3.87 - 5.11 MIL/uL   Hemoglobin 14.2  12.0 - 15.0 g/dL   HCT 16.1  09.6 - 04.5 %   MCV 87.4  78.0 - 100.0 fL   MCH 29.8  26.0 - 34.0 pg   MCHC 34.1  30.0 - 36.0 g/dL   RDW 40.9  81.1 - 91.4 %   Platelets 196  150 - 400 K/uL   Neutrophils Relative % 52  43 - 77 %   Neutro Abs 2.9  1.7 - 7.7 K/uL   Lymphocytes Relative 34  12 - 46 %   Lymphs Abs 1.8  0.7 - 4.0 K/uL   Monocytes Relative 10  3 - 12 %   Monocytes Absolute 0.5  0.1 - 1.0 K/uL   Eosinophils Relative 3  0 - 5 %   Eosinophils Absolute 0.1  0.0 - 0.7 K/uL   Basophils Relative 1  0 - 1 %   Basophils Absolute 0.1  0.0 - 0.1 K/uL   Smear Review Criteria for review not met    COMPREHENSIVE METABOLIC PANEL     Status: Abnormal   Collection Time    02/18/13  8:34 AM      Result Value Range   Sodium 140  135 - 145 mEq/L   Potassium 4.5  3.5 - 5.3 mEq/L   Chloride 106  96 - 112 mEq/L   CO2 28  19 - 32 mEq/L   Glucose, Bld 95  70 - 99 mg/dL   BUN 16  6 - 23 mg/dL   Creat 7.82  9.56 - 2.13 mg/dL   Total Bilirubin 1.4 (*) 0.3 - 1.2 mg/dL   Alkaline Phosphatase 64  39 - 117 U/L   AST 19  0 - 37 U/L   ALT 21  0 - 35 U/L   Total Protein 6.4  6.0 - 8.3 g/dL   Albumin 3.9  3.5 - 5.2 g/dL   Calcium 9.2  8.4 - 08.6 mg/dL  HEMOGLOBIN V7Q     Status: Abnormal   Collection Time    02/18/13  8:34 AM      Result Value Range   Hemoglobin A1C 5.7 (*) <5.7 %   Comment:                                                                            According to the ADA  Clinical Practice Recommendations for 2011, when     HbA1c is used as a screening test:             >=6.5%   Diagnostic of Diabetes Mellitus                (if abnormal result is confirmed)           5.7-6.4%   Increased risk of developing Diabetes Mellitus           References:Diagnosis and Classification of Diabetes Mellitus,Diabetes     Care,2011,34(Suppl 1):S62-S69 and Standards of Medical  Care in             Diabetes - 2011,Diabetes Care,2011,34 (Suppl 1):S11-S61.         Mean Plasma Glucose 117 (*) <117 mg/dL  URINALYSIS, ROUTINE W REFLEX MICROSCOPIC     Status: Abnormal   Collection Time    02/18/13  8:34 AM      Result Value Range   Color, Urine YELLOW  YELLOW   APPearance CLEAR  CLEAR   Specific Gravity, Urine 1.020  1.005 - 1.030   pH 7.0  5.0 - 8.0   Glucose, UA NEG  NEG mg/dL   Bilirubin Urine NEG  NEG   Ketones, ur NEG  NEG mg/dL   Hgb urine dipstick NEG  NEG   Protein, ur NEG  NEG mg/dL   Urobilinogen, UA 0.2  0.0 - 1.0 mg/dL   Nitrite NEG  NEG   Leukocytes, UA SMALL (*) NEG  LIPID PANEL     Status: None   Collection Time    02/18/13  8:34 AM      Result Value Range   Cholesterol 160  0 - 200 mg/dL   Comment: ATP III Classification:           < 200        mg/dL        Desirable          200 - 239     mg/dL        Borderline High          >= 240        mg/dL        High         Triglycerides 146  <150 mg/dL   HDL 43  >65 mg/dL   Total CHOL/HDL Ratio 3.7     VLDL 29  0 - 40 mg/dL   LDL Cholesterol 88  0 - 99 mg/dL   Comment:       Total Cholesterol/HDL Ratio:CHD Risk                            Coronary Heart Disease Risk Table                                            Men       Women              1/2 Average Risk              3.4        3.3                  Average Risk              5.0        4.4               2X Average Risk              9.6        7.1               3X Average Risk             23.4       11.0     Use the calculated Patient Ratio  above and the CHD Risk table  to determine the patient's CHD Risk.     ATP III Classification (LDL):           < 100        mg/dL         Optimal          100 - 129     mg/dL         Near or Above Optimal          130 - 159     mg/dL         Borderline High          160 - 189     mg/dL         High           > 190        mg/dL         Very High        TSH     Status: None   Collection Time    02/18/13  8:34 AM      Result Value Range   TSH 1.336  0.350 - 4.500 uIU/mL  URINALYSIS, MICROSCOPIC ONLY     Status: None   Collection Time    02/18/13  8:34 AM      Result Value Range   Squamous Epithelial / LPF NONE SEEN  RARE   Crystals NONE SEEN  NONE SEEN   Casts NONE SEEN  NONE SEEN   WBC, UA 0-2  <3 WBC/hpf   RBC / HPF 0-2  <3 RBC/hpf   Bacteria, UA NONE SEEN  RARE    Assessment/Plan: Palpitations Asymptomatic at present.  Continue metoprolol as prescribed. Will recheck TSH.  Unspecified hypothyroidism Discussed risks associated with switching from generic to branded levothyroxine.  Patient voices understanding.  Will Rx Syntrhoid 75 mcg daily.  Will obtain TSH.  TSH will need to be repeated in 1 month.  Follow-up at that time.  Hyperlipidemia Will obtain fasting lipid panel.  Continue Zocor as prescribed.  Visit for preventive health examination Will obtain fasting labs.  Patient followed by OBGYN.  DTP vaccine given by nursing staff.

## 2013-02-19 LAB — URINALYSIS, ROUTINE W REFLEX MICROSCOPIC
Bilirubin Urine: NEGATIVE
Glucose, UA: NEGATIVE mg/dL
Hgb urine dipstick: NEGATIVE
Ketones, ur: NEGATIVE mg/dL
Protein, ur: NEGATIVE mg/dL

## 2013-02-19 LAB — URINALYSIS, MICROSCOPIC ONLY: Squamous Epithelial / LPF: NONE SEEN

## 2013-02-20 DIAGNOSIS — E785 Hyperlipidemia, unspecified: Secondary | ICD-10-CM | POA: Insufficient documentation

## 2013-02-20 DIAGNOSIS — Z0000 Encounter for general adult medical examination without abnormal findings: Secondary | ICD-10-CM | POA: Insufficient documentation

## 2013-02-20 DIAGNOSIS — E039 Hypothyroidism, unspecified: Secondary | ICD-10-CM | POA: Insufficient documentation

## 2013-02-20 NOTE — Assessment & Plan Note (Signed)
Discussed risks associated with switching from generic to branded levothyroxine.  Patient voices understanding.  Will Rx Syntrhoid 75 mcg daily.  Will obtain TSH.  TSH will need to be repeated in 1 month.  Follow-up at that time.

## 2013-02-20 NOTE — Assessment & Plan Note (Signed)
Will obtain fasting labs.  Patient followed by OBGYN.  DTP vaccine given by nursing staff.

## 2013-02-20 NOTE — Assessment & Plan Note (Signed)
Will obtain fasting lipid panel.  Continue Zocor as prescribed.

## 2013-02-20 NOTE — Assessment & Plan Note (Signed)
Asymptomatic at present.  Continue metoprolol as prescribed. Will recheck TSH.

## 2013-02-22 ENCOUNTER — Telehealth: Payer: Self-pay | Admitting: Physician Assistant

## 2013-02-22 NOTE — Telephone Encounter (Signed)
Received medical records from [facility] ° °

## 2013-03-07 ENCOUNTER — Encounter: Payer: Self-pay | Admitting: Physician Assistant

## 2013-03-18 ENCOUNTER — Ambulatory Visit: Payer: 59 | Admitting: Physician Assistant

## 2013-06-16 ENCOUNTER — Encounter: Payer: Self-pay | Admitting: Physician Assistant

## 2013-06-16 ENCOUNTER — Ambulatory Visit (INDEPENDENT_AMBULATORY_CARE_PROVIDER_SITE_OTHER): Payer: 59 | Admitting: Physician Assistant

## 2013-06-16 VITALS — BP 118/78 | HR 60 | Temp 97.8°F | Resp 16 | Ht 66.0 in | Wt 210.0 lb

## 2013-06-16 DIAGNOSIS — S8992XA Unspecified injury of left lower leg, initial encounter: Secondary | ICD-10-CM

## 2013-06-16 DIAGNOSIS — S99919A Unspecified injury of unspecified ankle, initial encounter: Secondary | ICD-10-CM

## 2013-06-16 DIAGNOSIS — J309 Allergic rhinitis, unspecified: Secondary | ICD-10-CM

## 2013-06-16 DIAGNOSIS — E039 Hypothyroidism, unspecified: Secondary | ICD-10-CM

## 2013-06-16 DIAGNOSIS — J302 Other seasonal allergic rhinitis: Secondary | ICD-10-CM | POA: Insufficient documentation

## 2013-06-16 DIAGNOSIS — S99929A Unspecified injury of unspecified foot, initial encounter: Secondary | ICD-10-CM

## 2013-06-16 DIAGNOSIS — S8990XA Unspecified injury of unspecified lower leg, initial encounter: Secondary | ICD-10-CM

## 2013-06-16 LAB — TSH: TSH: 1.406 u[IU]/mL (ref 0.350–4.500)

## 2013-06-16 LAB — T4, FREE: Free T4: 1.2 ng/dL (ref 0.80–1.80)

## 2013-06-16 NOTE — Assessment & Plan Note (Signed)
Recheck TSH and T4 

## 2013-06-16 NOTE — Assessment & Plan Note (Signed)
Physical exam unremarkable for acute pathology.  Patient able to ambulate without difficulty.  Tylenol or Ibuprofen if needed for pain.  Heating pad to area.  Call if symptoms do not continue to improve.

## 2013-06-16 NOTE — Assessment & Plan Note (Signed)
Daily claritin.  Flonase to help relieve ear pressure and fluid behind TM to reduce risk of AOM.

## 2013-06-16 NOTE — Patient Instructions (Signed)
Please go to lab.  I will call you with your results.  Continue medications as prescribed.  For allergies -- please use Flonase daily and take Claritin daily for seasonal allergy symptoms.  Hypothyroidism The thyroid is a large gland located in the lower front of your neck. The thyroid gland helps control metabolism. Metabolism is how your body handles food. It controls metabolism with the hormone thyroxine. When this gland is underactive (hypothyroid), it produces too little hormone.  CAUSES These include:   Absence or destruction of thyroid tissue.  Goiter due to iodine deficiency.  Goiter due to medications.  Congenital defects (since birth).  Problems with the pituitary. This causes a lack of TSH (thyroid stimulating hormone). This hormone tells the thyroid to turn out more hormone. SYMPTOMS  Lethargy (feeling as though you have no energy)  Cold intolerance  Weight gain (in spite of normal food intake)  Dry skin  Coarse hair  Menstrual irregularity (if severe, may lead to infertility)  Slowing of thought processes Cardiac problems are also caused by insufficient amounts of thyroid hormone. Hypothyroidism in the newborn is cretinism, and is an extreme form. It is important that this form be treated adequately and immediately or it will lead rapidly to retarded physical and mental development. DIAGNOSIS  To prove hypothyroidism, your caregiver may do blood tests and ultrasound tests. Sometimes the signs are hidden. It may be necessary for your caregiver to watch this illness with blood tests either before or after diagnosis and treatment. TREATMENT  Low levels of thyroid hormone are increased by using synthetic thyroid hormone. This is a safe, effective treatment. It usually takes about four weeks to gain the full effects of the medication. After you have the full effect of the medication, it will generally take another four weeks for problems to leave. Your caregiver may start  you on low doses. If you have had heart problems the dose may be gradually increased. It is generally not an emergency to get rapidly to normal. HOME CARE INSTRUCTIONS   Take your medications as your caregiver suggests. Let your caregiver know of any medications you are taking or start taking. Your caregiver will help you with dosage schedules.  As your condition improves, your dosage needs may increase. It will be necessary to have continuing blood tests as suggested by your caregiver.  Report all suspected medication side effects to your caregiver. SEEK MEDICAL CARE IF: Seek medical care if you develop:  Sweating.  Tremulousness (tremors).  Anxiety.  Rapid weight loss.  Heat intolerance.  Emotional swings.  Diarrhea.  Weakness. SEEK IMMEDIATE MEDICAL CARE IF:  You develop chest pain, an irregular heart beat (palpitations), or a rapid heart beat. MAKE SURE YOU:   Understand these instructions.  Will watch your condition.  Will get help right away if you are not doing well or get worse. Document Released: 03/17/2005 Document Revised: 06/09/2011 Document Reviewed: 11/05/2007 [facility] Patient Information 2014 [employer], [place].

## 2013-06-16 NOTE — Progress Notes (Signed)
Pre visit review using our clinic review tool, if applicable. No additional management support is needed unless otherwise documented below in the visit note/SLS  

## 2013-06-16 NOTE — Progress Notes (Signed)
Patient presents to clinic today for follow-up of hypothyroidism.  Patient currently on Levothyroxine 75 mcg.  Endorses taking as prescribed.  TSH within normal limit at last check.  Will need repeat TSH and T4.  Patient also endorses rhinorrhea, watery eyes and ear pressure bilaterally.  Has history of seasonal allergies. Has not taken anything for her symptoms.  Patient also complains of mild L knee tenderness after falling out of bed last night. Denies bruising or swelling. No head injury of trauma elsewhere.  Able to weight bear.  Some tenderness with walking up and down steps. Has not taken anything for pain because she states it is not very tender.  Past Medical History  Diagnosis Date  . Thyroid disease   . Hyperlipidemia   . Malrotation of colon   . Anxiety   . Palpitations   . Chicken pox     Current Outpatient Prescriptions on File Prior to Visit  Medication Sig Dispense Refill  . levothyroxine (SYNTHROID) 75 MCG tablet Take 1 tablet (75 mcg total) by mouth daily before breakfast.  30 tablet  3  . metoprolol tartrate (LOPRESSOR) 25 MG tablet Take 1/2 tablet twice a day  90 tablet  3  . simvastatin (ZOCOR) 20 MG tablet Take 1 tablet (20 mg total) by mouth at bedtime.  90 tablet  3   No current facility-administered medications on file prior to visit.    Allergies  Allergen Reactions  . Augmentin [Amoxicillin-Pot Clavulanate]     "gastric issues"  . Erythromycin     "Gastric issues"    Family History  Problem Relation Age of Onset  . Heart Problems Father     cardiac stents  . Hyperlipidemia Father   . Heart Problems Mother     Palpitations  . Transient ischemic attack Mother   . Diabetes Brother   . Arthritis-Osteo Father   . Arthritis-Osteo Mother   . Arthritis Sister   . Colon cancer Maternal Grandmother   . Heart disease Paternal Grandmother   . Heart disease Paternal Grandfather   . Memory loss Mother   . Gallbladder disease Brother     History    Social History  . Marital Status: Married    Spouse Name: N/A    Number of Children: N/A  . Years of Education: N/A   Social History Main Topics  . Smoking status: Never Smoker   . Smokeless tobacco: Never Used  . Alcohol Use: Yes     Comment: 1/2 bottle wine  . Drug Use: No  . Sexual Activity: None   Other Topics Concern  . None   Social History Narrative  . None   Review of Systems - See HPI.  All other ROS are negative.  BP 118/78  Pulse 60  Temp(Src) 97.8 F (36.6 C) (Oral)  Resp 16  Ht 5\' 6"  (1.676 m)  Wt 210 lb (95.255 kg)  BMI 33.91 kg/m2  SpO2 97%  Physical Exam  Vitals reviewed. Constitutional: She is oriented to person, place, and time and well-developed, well-nourished, and in no distress.  HENT:  Head: Normocephalic and atraumatic.  TM with mild retraction and fluid noted. No evidence of AOM.  Eyes: Conjunctivae are normal. Pupils are equal, round, and reactive to light.  Neck: Neck supple. No thyromegaly present.  Cardiovascular: Normal rate, regular rhythm, normal heart sounds and intact distal pulses.   Pulmonary/Chest: Effort normal and breath sounds normal. No respiratory distress. She has no wheezes. She has no rales. She exhibits  no tenderness.  Musculoskeletal:       Left knee: She exhibits normal range of motion, no swelling, no ecchymosis, no deformity and no laceration. No medial joint line, no lateral joint line, no MCL and no LCL tenderness noted.  Neurological: She is alert and oriented to person, place, and time.  Skin: Skin is warm and dry. No rash noted.  Psychiatric: Affect normal.    No results found for this or any previous visit (from the past 2160 hour(s)).  Assessment/Plan: Hypothyroidism Recheck TSH and T4.    Seasonal allergies Daily claritin.  Flonase to help relieve ear pressure and fluid behind TM to reduce risk of AOM.  Left knee injury Physical exam unremarkable for acute pathology.  Patient able to ambulate  without difficulty.  Tylenol or Ibuprofen if needed for pain.  Heating pad to area.  Call if symptoms do not continue to improve.

## 2013-06-22 ENCOUNTER — Telehealth: Payer: Self-pay | Admitting: *Deleted

## 2013-06-22 DIAGNOSIS — E039 Hypothyroidism, unspecified: Secondary | ICD-10-CM

## 2013-06-22 MED ORDER — LEVOTHYROXINE SODIUM 75 MCG PO TABS: 75.0000 ug | ORAL_TABLET | Freq: Every day | ORAL | Status: DC

## 2013-06-22 NOTE — Telephone Encounter (Signed)
Pt called to question follow up. Pt states she was told she could follow up in 1 year at her visit but when we called with her results we told her f/u in 6 months. Advised pt per lab note that she should follow up in 6 months. Pt scheduled appt for 12/23/13 at 8:15am and will be fasting in case labs will be due.  Pt requests refill of levothyroxine to [facility].  Refill sent.

## 2013-07-18 ENCOUNTER — Encounter: Payer: Self-pay | Admitting: Physician Assistant

## 2013-07-18 ENCOUNTER — Other Ambulatory Visit (INDEPENDENT_AMBULATORY_CARE_PROVIDER_SITE_OTHER): Payer: 59

## 2013-07-18 DIAGNOSIS — Z1239 Encounter for other screening for malignant neoplasm of breast: Secondary | ICD-10-CM

## 2013-07-18 DIAGNOSIS — E7800 Pure hypercholesterolemia, unspecified: Secondary | ICD-10-CM

## 2013-07-18 LAB — LIPID PANEL
Cholesterol: 163 mg/dL (ref 0–200)
HDL: 43.3 mg/dL (ref >39.00)
LDL Cholesterol: 99 mg/dL (ref 0–99)
Total CHOL/HDL Ratio: 4
Triglycerides: 104 mg/dL (ref 0.0–149.0)
VLDL: 20.8 mg/dL (ref 0.0–40.0)

## 2013-07-18 LAB — HEPATIC FUNCTION PANEL
ALT: 18 U/L (ref 0–35)
AST: 19 U/L (ref 0–37)
Albumin: 3.7 g/dL (ref 3.5–5.2)
Alkaline Phosphatase: 59 U/L (ref 39–117)
Bilirubin, Direct: 0.2 mg/dL (ref 0.0–0.3)
Total Bilirubin: 1.7 mg/dL — ABNORMAL HIGH (ref 0.3–1.2)
Total Protein: 6.5 g/dL (ref 6.0–8.3)

## 2013-07-20 ENCOUNTER — Encounter: Payer: Self-pay | Admitting: Cardiology

## 2013-07-20 ENCOUNTER — Ambulatory Visit (INDEPENDENT_AMBULATORY_CARE_PROVIDER_SITE_OTHER): Payer: 59 | Admitting: Cardiology

## 2013-07-20 VITALS — BP 128/78 | HR 64 | Ht 66.0 in | Wt 209.0 lb

## 2013-07-20 DIAGNOSIS — R002 Palpitations: Secondary | ICD-10-CM

## 2013-07-20 DIAGNOSIS — E785 Hyperlipidemia, unspecified: Secondary | ICD-10-CM

## 2013-07-20 MED ORDER — SIMVASTATIN 20 MG PO TABS: 20.0000 mg | ORAL_TABLET | Freq: Every day | ORAL | Status: DC

## 2013-07-20 MED ORDER — METOPROLOL TARTRATE 25 MG PO TABS: ORAL_TABLET | ORAL | Status: DC

## 2013-07-20 NOTE — Progress Notes (Signed)
Patient ID: Courtney Valenzuela Heidrich, female   DOB: [birth year], 63 y.o.   MRN: [id number]    Patient Name: Courtney Valenzuela Moscoso Date of Encounter: 07/20/2013  Primary Care Provider:  Piedad ClimesMartin, William Cody, PA-C Primary Cardiologist:  Lars MassonKatarina H Tavarus Poteete  Patient Profile  Palpitations  Problem List   Past Medical History  Diagnosis Date  . Thyroid disease   . Hyperlipidemia   . Malrotation of colon   . Anxiety   . Palpitations   . Chicken pox    Past Surgical History  Procedure Laterality Date  . Tonsillectomy    . Abdominal hysterectomy    . Shoulder surgery    . Thyroidectomy, partial    . Laproscopy      Abdominal Scar TRissue  . Wrist surgery    . Knee surgery     Allergies  Allergies  Allergen Reactions  . Augmentin [Amoxicillin-Pot Clavulanate]     "gastric issues"  . Erythromycin     "Gastric issues"   HPI  63 year old female with h/o hypothyroidism, hyperlipidemia who went to the ER for sudden onset palpitations while at work. They lasted for about 15 minutes before she went to the ER. She was found to be in sinus tachycardia with possible left atrial enlargement on the ECG. She was ruled out for ACS. She was so anxious at the time that she doesn't remember if she had any associated SOB or chest pain. The tachycardia slowly resolved. The patient is otherwise physically active, she describes a walk of 40 blocks the last weekend without any chest pain or SOB. [facility] [place] by [facility] cardiologist as a part of a study with hereditary hyperlipidemia. Her father has severe hypercholesterolemia and CAD with 3 stents placements in the past.  She had a negative stress test in 2011, negative cath in 2004 and was treated for hypercholesterolemia by Simvastatin/niacin combination. She doesn't tolerate hot flushes associated with Niacin very well.  Holter monitor was completely normal. She was started on low dose metoprolol. Today she reports feeling well and resolution of  all of her symptoms.   Home Medications  Prior to Admission medications   Medication Sig Start Date End Date Taking? Authorizing Provider  levothyroxine (SYNTHROID, LEVOTHROID) 75 MCG tablet Take 75 mcg by mouth daily before breakfast.   Yes Historical Provider, MD  niacin-simvastatin (SIMCOR) 1000-20 MG 24 hr tablet Take 1 tablet by mouth at bedtime.   Yes Historical Provider, MD   Family History  Family History  Problem Relation Age of Onset  . Heart Problems Father     cardiac stents  . Hyperlipidemia Father   . Heart Problems Mother     Palpitations  . Transient ischemic attack Mother   . Diabetes Brother   . Arthritis-Osteo Father   . Arthritis-Osteo Mother   . Arthritis Sister   . Colon cancer Maternal Grandmother   . Heart disease Paternal Grandmother   . Heart disease Paternal Grandfather   . Memory loss Mother   . Gallbladder disease Brother    Social History  Review of Systems General:  No chills, fever, night sweats or weight changes.  Cardiovascular:  No chest pain, dyspnea on exertion, edema, orthopnea, + palpitations, paroxysmal nocturnal dyspnea. Dermatological: No rash, lesions/masses Respiratory: No cough, dyspnea Urologic: No hematuria, dysuria Abdominal:   No nausea, vomiting, diarrhea, bright red blood per rectum, melena, or hematemesis Neurologic:  No visual changes, wkns, changes in mental status. All other systems reviewed and are  otherwise negative except as noted above.  Physical Exam  Blood pressure 128/78, pulse 64, height 5\' 6"  (1.676 m), weight 209 lb (94.802 kg).  General: Pleasant, NAD Psych: Normal affect. Neuro: Alert and oriented X 3. Moves all extremities spontaneously. HEENT: Normal  Neck: Supple without bruits or JVD. Lungs:  Resp regular and unlabored, CTA. Heart: RRR no s3, s4, or murmurs. Abdomen: Soft, non-tender, non-distended, BS + x 4.  Extremities: No clubbing, cyanosis or edema. DP/PT/Radials 2+ and equal  bilaterally.  Accessory Clinical Findings  ECG - SR, 76 BPM, normal ECG  Echo: 01/12/2013 ------------------------------------------------------------ Study Conclusions  - Left ventricle: The cavity size was normal. Wall thickness was normal. Systolic function was normal. The estimated ejection fraction was in the range of 60% to 65%. Wall motion was normal; there were no regional wall motion abnormalities. Features are consistent with a pseudonormal left ventricular filling pattern, with concomitant abnormal relaxation and increased filling pressure (grade 2 diastolic dysfunction). - Aortic valve: There was no stenosis. - Mitral valve: No significant regurgitation. - Left atrium: The atrium was moderately dilated. - Right ventricle: The cavity size was normal. Systolic function was normal. - Pulmonary arteries: No complete TR doppler jet so unable to estimate PA systolic pressure. - Inferior vena cava: The vessel was normal in size; the respirophasic diameter changes were in the normal range (= 50%); findings are consistent with normal central venous pressure. Impressions:  - Normal LV size and systolic function, EF 60-65%. Normal RV size and systolic function. No significant valvular abnormalities.   Assessment & Plan  63 year old female   1. Palpitations - there is only 1 3 beat SVT during 48Hour Holter, echo shows normal LV EF but pseudonormal pattern, moderately enlarged LA. After initiation of Metoprolol 12.5 mg PO BID resolution of her symptoms.  2. Hypothyroidism - on Synthroid, TSH normal  3. Hyperlipidemia - well controlled, side effects of niacin, we wil d/c and continue Simvastatin only. The patient is encouraged to exercise. Normal AST, ALT, rising Bili  1.1-->1.4-->1.7, we will check again in 2 months and refer to GI if continues to rise. The patient has GB problems, but would like to wait with GI visit for now.  Follow up in 1 year, CMP in 2  months.   Lars MassonKatarina H Marc Leichter, MD 07/20/2013, 9:55 AM

## 2013-07-20 NOTE — Patient Instructions (Signed)
Your physician recommends that you return for lab work in: 3 MONTHS  October 19, 2013 (CMET)  Your physician wants you to follow-up in: 1 YEAR WITH DR Johnell ComingsNELSON You will receive a reminder letter in the mail two months in advance. If you don't receive a letter, please call our office to schedule the follow-up appointment.

## 2013-08-02 ENCOUNTER — Ambulatory Visit
Admission: RE | Admit: 2013-08-02 | Discharge: 2013-08-02 | Disposition: A | Discharge status: Home / Self Care | Payer: 59 | Source: Ambulatory Visit | Attending: Physician Assistant | Admitting: Physician Assistant

## 2013-08-02 DIAGNOSIS — Z1239 Encounter for other screening for malignant neoplasm of breast: Secondary | ICD-10-CM

## 2013-08-12 ENCOUNTER — Encounter: Payer: Self-pay | Admitting: Physician Assistant

## 2013-08-19 ENCOUNTER — Encounter: Payer: Self-pay | Admitting: Physician Assistant

## 2013-08-19 ENCOUNTER — Ambulatory Visit (INDEPENDENT_AMBULATORY_CARE_PROVIDER_SITE_OTHER): Payer: 59 | Admitting: Physician Assistant

## 2013-08-19 VITALS — BP 126/78 | HR 50 | Temp 97.6°F | Resp 16 | Ht 66.0 in | Wt 210.0 lb

## 2013-08-19 DIAGNOSIS — M26629 Arthralgia of temporomandibular joint, unspecified side: Secondary | ICD-10-CM

## 2013-08-19 NOTE — Progress Notes (Signed)
Patient presents to clinic today c/o pain in her right ear that has been present for 2 days.  Patient with history of seasonal allergies, currently on Claritin and Flonase daily.  Denies sinus pressure, sinus pain, tooth pain, ear drainage, tinnitus or decreased hearing.  Endorses some tenderness with ROM of jaw, but denies trauma or injury.  Has occasional headache when her allergies are "acting up", but denies current symptoms.  Denies recent travel or sick contact.  Past Medical History  Diagnosis Date  . Thyroid disease   . Hyperlipidemia   . Malrotation of colon   . Anxiety   . Palpitations   . Chicken pox   . Diverticulitis   . Cubital tunnel syndrome   . Seasonal allergies   . Malrotation of colon 2014    Appendix/entire colon on Left    Current Outpatient Prescriptions on File Prior to Visit  Medication Sig Dispense Refill  . fluticasone (FLONASE) 50 MCG/ACT nasal spray Place 1 spray into both nostrils daily.      Marland Kitchen levothyroxine (SYNTHROID) 75 MCG tablet Take 1 tablet (75 mcg total) by mouth daily before breakfast.  90 tablet  1  . Loratadine (CLARITIN) 10 MG CAPS Take 10 mg by mouth daily.      . metoprolol tartrate (LOPRESSOR) 25 MG tablet Take 1/2 tablet twice a day  90 tablet  3  . simvastatin (ZOCOR) 20 MG tablet Take 1 tablet (20 mg total) by mouth at bedtime.  90 tablet  3   No current facility-administered medications on file prior to visit.    Allergies  Allergen Reactions  . Augmentin [Amoxicillin-Pot Clavulanate]     "gastric issues"  . Erythromycin     "Gastric issues"    Family History  Problem Relation Age of Onset  . Heart Problems Father     cardiac stents  . Hyperlipidemia Father   . Heart Problems Mother     Palpitations  . Transient ischemic attack Mother   . Diabetes Brother   . Arthritis-Osteo Father   . Arthritis-Osteo Mother   . Arthritis Sister   . Colon cancer Maternal Grandmother   . Heart disease Paternal Grandmother   . Heart  disease Paternal Grandfather   . Memory loss Mother   . Gallbladder disease Brother   . Hypertension Other     History   Social History  . Marital Status: Married    Spouse Name: N/A    Number of Children: N/A  . Years of Education: N/A   Social History Main Topics  . Smoking status: Never Smoker   . Smokeless tobacco: Never Used  . Alcohol Use: Yes     Comment: 1/2 bottle wine  . Drug Use: No  . Sexual Activity: None   Other Topics Concern  . None   Social History Narrative  . None   Review of Systems - See HPI.  All other ROS are negative.  BP 126/78  Pulse 50  Temp(Src) 97.6 F (36.4 C) (Oral)  Resp 16  Ht 5\' 6"  (1.676 m)  Wt 210 lb (95.255 kg)  BMI 33.91 kg/m2  SpO2 99%  Physical Exam  Vitals reviewed. Constitutional: She is oriented to person, place, and time and well-developed, well-nourished, and in no distress.  HENT:  Head: Normocephalic and atraumatic.  Right Ear: Tympanic membrane, external ear and ear canal normal.  Left Ear: Tympanic membrane, external ear and ear canal normal.  Nose: Nose normal. Right sinus exhibits no maxillary sinus tenderness  and no frontal sinus tenderness. Left sinus exhibits no maxillary sinus tenderness and no frontal sinus tenderness.  Mouth/Throat: Uvula is midline, oropharynx is clear and moist and mucous membranes are normal. No oral lesions. Normal dentition. No lacerations or dental caries.  Crepitus of right TMJ joint noted on exam.  Pain at TMJ with lateral motion of jaw.  Eyes: Conjunctivae are normal. Pupils are equal, round, and reactive to light.  Neck: Neck supple.  Cardiovascular: Normal rate, regular rhythm, normal heart sounds and intact distal pulses.   Pulmonary/Chest: Effort normal and breath sounds normal. No respiratory distress. She has no wheezes. She has no rales. She exhibits no tenderness.  Lymphadenopathy:    She has no cervical adenopathy.  Neurological: She is alert and oriented to person,  place, and time.  Skin: Skin is warm and dry. No rash noted.  Psychiatric: Affect normal.    Recent Results (from the past 2160 hour(s))  TSH     Status: None   Collection Time    06/16/13  8:10 AM      Result Value Ref Range   TSH 1.406  0.350 - 4.500 uIU/mL  T4, FREE     Status: None   Collection Time    06/16/13  8:10 AM      Result Value Ref Range   Free T4 1.20  0.80 - 1.80 ng/dL  LIPID PANEL     Status: None   Collection Time    07/18/13  8:03 AM      Result Value Ref Range   Cholesterol 163  0 - 200 mg/dL   Comment: ATP III Classification       Desirable:  < 200 mg/dL               Borderline High:  200 - 239 mg/dL          High:  > = 161240 mg/dL   Triglycerides 096.0104.0  0.0 - 149.0 mg/dL   Comment: Normal:  <454<150 mg/dLBorderline High:  150 - 199 mg/dL   HDL 09.8143.30  >19.14>39.00 mg/dL   VLDL 78.220.8  0.0 - 95.640.0 mg/dL   LDL Cholesterol 99  0 - 99 mg/dL   Total CHOL/HDL Ratio 4     Comment:                Men          Women1/2 Average Risk     3.4          3.3Average Risk          5.0          4.42X Average Risk          9.6          7.13X Average Risk          15.0          11.0                      HEPATIC FUNCTION PANEL     Status: Abnormal   Collection Time    07/18/13  8:03 AM      Result Value Ref Range   Total Bilirubin 1.7 (*) 0.3 - 1.2 mg/dL   Bilirubin, Direct 0.2  0.0 - 0.3 mg/dL   Alkaline Phosphatase 59  39 - 117 U/L   AST 19  0 - 37 U/L   ALT 18  0 - 35 U/L   Total Protein 6.5  6.0 -  8.3 g/dL   Albumin 3.7  3.5 - 5.2 g/dL    Assessment/Plan: TMJ tenderness Ibuprofen.  Topical Aspercreme.  Ice. Avoid chewing on affected side.  No evidence of otic or dental infection.  Call or return to clinic if symptoms are not improving.

## 2013-08-19 NOTE — Assessment & Plan Note (Signed)
Ibuprofen.  Topical Aspercreme.  Ice. Avoid chewing on affected side.  No evidence of otic or dental infection.  Call or return to clinic if symptoms are not improving.

## 2013-08-19 NOTE — Progress Notes (Signed)
Pre visit review using our clinic review tool, if applicable. No additional management support is needed unless otherwise documented below in the visit note/SLS  

## 2013-08-19 NOTE — Patient Instructions (Signed)
I feel like your pain is stemming from inflammation/irritation of your TMJ joint (read information below).  Please take Ibuprofen for pain.  Apply a topical Icy Hot or Aspercreme around the ear and at the site of pain.  Avoid chewing on the affected side. Please call if there is no symptoms improvement by next week.  Temporomandibular Problems  Temporomandibular joint (TMJ) dysfunction means there are problems with the joint between your jaw and your skull. This is a joint lined by cartilage like other joints in your body but also has a small disc in the joint which keeps the bones from rubbing on each other. These joints are like other joints and can get inflamed (sore) from arthritis and other problems. When this joint gets sore, it can cause headaches and pain in the jaw and the face. CAUSES  Usually the arthritic types of problems are caused by soreness in the joint. Soreness in the joint can also be caused by overuse. This may come from grinding your teeth. It may also come from mis-alignment in the joint. DIAGNOSIS Diagnosis of this condition can often be made by history and exam. Sometimes your caregiver may need X-rays or an MRI scan to determine the exact cause. It may be necessary to see your dentist to determine if your teeth and jaws are lined up correctly. TREATMENT  Most of the time this problem is not serious; however, sometimes it can persist (become chronic). When this happens medications that will cut down on inflammation (soreness) help. Sometimes a shot of cortisone into the joint will be helpful. If your teeth are not aligned it may help for your dentist to make a splint for your mouth that can help this problem. If no physical problems can be found, the problem may come from tension. If tension is found to be the cause, biofeedback or relaxation techniques may be helpful. HOME CARE INSTRUCTIONS   Later in the day, applications of ice packs may be helpful. Ice can be used in a  plastic bag with a towel around it to prevent frostbite to skin. This may be used about every 2 hours for 20 to 30 minutes, as needed while awake, or as directed by your caregiver.  Only take over-the-counter or prescription medicines for pain, discomfort, or fever as directed by your caregiver.  If physical therapy was prescribed, follow your caregiver's directions.  Wear mouth appliances as directed if they were given. Document Released: 12/10/2000 Document Revised: 06/09/2011 Document Reviewed: 03/19/2008 [facility] Patient Information 2014 [place], [place].

## 2013-08-26 ENCOUNTER — Ambulatory Visit: Payer: 59 | Admitting: Physician Assistant

## 2013-10-19 ENCOUNTER — Other Ambulatory Visit (INDEPENDENT_AMBULATORY_CARE_PROVIDER_SITE_OTHER): Payer: 59

## 2013-10-19 DIAGNOSIS — E785 Hyperlipidemia, unspecified: Secondary | ICD-10-CM

## 2013-10-19 DIAGNOSIS — R002 Palpitations: Secondary | ICD-10-CM

## 2013-10-19 LAB — COMPREHENSIVE METABOLIC PANEL
ALT: 18 U/L (ref 0–35)
AST: 20 U/L (ref 0–37)
Albumin: 3.6 g/dL (ref 3.5–5.2)
Alkaline Phosphatase: 62 U/L (ref 39–117)
BUN: 16 mg/dL (ref 6–23)
CO2: 27 mEq/L (ref 19–32)
Calcium: 9.1 mg/dL (ref 8.4–10.5)
Chloride: 109 mEq/L (ref 96–112)
Creatinine, Ser: 0.9 mg/dL (ref 0.4–1.2)
GFR: 64.97 mL/min (ref >60.00)
Glucose, Bld: 105 mg/dL — ABNORMAL HIGH (ref 70–99)
Potassium: 4.4 mEq/L (ref 3.5–5.1)
Sodium: 141 mEq/L (ref 135–145)
Total Bilirubin: 0.9 mg/dL (ref 0.2–1.2)
Total Protein: 6.5 g/dL (ref 6.0–8.3)

## 2013-12-23 ENCOUNTER — Ambulatory Visit: Payer: 59 | Admitting: Physician Assistant

## 2014-01-05 ENCOUNTER — Telehealth: Payer: Self-pay | Admitting: Physician Assistant

## 2014-01-05 NOTE — Telephone Encounter (Signed)
Pt notified that she can get her flu shot tomorrow at 7am, at the lab.

## 2014-01-05 NOTE — Telephone Encounter (Signed)
Caller name: Clydie BraunKaren  Relation to ZO:XWRUpt:self  Call back number: [phone] Pharmacy:  Reason for call: pt has a follow up appointment 01/06/14 at 7am and would like to know if she can have the flu shot done as well. Please advise

## 2014-01-06 ENCOUNTER — Encounter: Payer: Self-pay | Admitting: Physician Assistant

## 2014-01-06 ENCOUNTER — Ambulatory Visit (INDEPENDENT_AMBULATORY_CARE_PROVIDER_SITE_OTHER): Payer: 59 | Admitting: Physician Assistant

## 2014-01-06 VITALS — BP 134/66 | HR 49 | Temp 98.2°F | Resp 16 | Ht 66.0 in | Wt 210.0 lb

## 2014-01-06 DIAGNOSIS — E039 Hypothyroidism, unspecified: Secondary | ICD-10-CM

## 2014-01-06 DIAGNOSIS — K5732 Diverticulitis of large intestine without perforation or abscess without bleeding: Secondary | ICD-10-CM

## 2014-01-06 DIAGNOSIS — Z23 Encounter for immunization: Secondary | ICD-10-CM

## 2014-01-06 DIAGNOSIS — Q433 Congenital malformations of intestinal fixation: Secondary | ICD-10-CM

## 2014-01-06 MED ORDER — LEVOTHYROXINE SODIUM 75 MCG PO TABS: 75.0000 ug | ORAL_TABLET | Freq: Every day | ORAL | Status: DC

## 2014-01-06 NOTE — Assessment & Plan Note (Signed)
Recurrent.  No acute flare up at present. Referral placed to General Surgery for assessment.

## 2014-01-06 NOTE — Progress Notes (Signed)
Patient presents to clinic today for follow-up of hypothyroidism.  Patient controlled for the past few years on 75 mcg.  Tolerating medication well.  No concerns at present. TSH due in 05/2014.   Patient also requesting referral to General Surgery for evaluation of partial colectomy for recurrent diverticulitis and malrotation of colon.  [facility].  [facility] but patient wishes to see specialist in [place].  Past Medical History  Diagnosis Date  . Thyroid disease   . Hyperlipidemia   . Malrotation of colon   . Anxiety   . Palpitations   . Chicken pox   . Diverticulitis   . Cubital tunnel syndrome   . Seasonal allergies   . Malrotation of colon 2014    Appendix/entire colon on Left    Current Outpatient Prescriptions on File Prior to Visit  Medication Sig Dispense Refill  . fluticasone (FLONASE) 50 MCG/ACT nasal spray Place 1 spray into both nostrils daily.      . Loratadine (CLARITIN) 10 MG CAPS Take 10 mg by mouth daily.      . metoprolol tartrate (LOPRESSOR) 25 MG tablet Take 1/2 tablet twice a day  90 tablet  3  . simvastatin (ZOCOR) 20 MG tablet Take 1 tablet (20 mg total) by mouth at bedtime.  90 tablet  3   No current facility-administered medications on file prior to visit.    Allergies  Allergen Reactions  . Augmentin [Amoxicillin-Pot Clavulanate]     "gastric issues"  . Erythromycin     "Gastric issues"    Family History  Problem Relation Age of Onset  . Heart Problems Father     cardiac stents  . Hyperlipidemia Father   . Heart Problems Mother     Palpitations  . Transient ischemic attack Mother   . Diabetes Brother   . Arthritis-Osteo Father   . Arthritis-Osteo Mother   . Arthritis Sister   . Colon cancer Maternal Grandmother   . Heart disease Paternal Grandmother   . Heart disease Paternal Grandfather   . Memory loss Mother   . Gallbladder disease Brother   . Hypertension Other      History   Social History  . Marital Status: Married    Spouse Name: N/A    Number of Children: N/A  . Years of Education: N/A   Social History Main Topics  . Smoking status: Never Smoker   . Smokeless tobacco: Never Used  . Alcohol Use: Yes     Comment: 1/2 bottle wine  . Drug Use: No  . Sexual Activity: None   Other Topics Concern  . None   Social History Narrative  . None   Review of Systems - See HPI.  All other ROS are negative.  BP 134/66  Pulse 49  Temp(Src) 98.2 F (36.8 C) (Oral)  Resp 16  Ht 5\' 6"  (1.676 m)  Wt 210 lb (95.255 kg)  BMI 33.91 kg/m2  SpO2 98%  Physical Exam  Vitals reviewed. Constitutional: She is oriented to person, place, and time and well-developed, well-nourished, and in no distress.  HENT:  Head: Normocephalic and atraumatic.  Eyes: Conjunctivae are normal.  Neck: Neck supple.  Cardiovascular: Normal rate, regular rhythm, normal heart sounds and intact distal pulses.   Pulmonary/Chest: Effort normal and breath sounds normal. No respiratory distress. She has no wheezes. She has no rales. She exhibits no tenderness.  Neurological: She is alert and oriented to person, place, and time.  Skin: Skin is warm and dry. No rash noted.    Recent Results (from the past 2160 hour(s))  COMPREHENSIVE METABOLIC PANEL     Status: Abnormal   Collection Time    10/19/13  8:38 AM      Result Value Ref Range   Sodium 141  135 - 145 mEq/L   Potassium 4.4  3.5 - 5.1 mEq/L   Chloride 109  96 - 112 mEq/L   CO2 27  19 - 32 mEq/L   Glucose, Bld 105 (*) 70 - 99 mg/dL   BUN 16  6 - 23 mg/dL   Creatinine, Ser 0.9  0.4 - 1.2 mg/dL   Total Bilirubin 0.9  0.2 - 1.2 mg/dL   Alkaline Phosphatase 62  39 - 117 U/L   AST 20  0 - 37 U/L   ALT 18  0 - 35 U/L   Total Protein 6.5  6.0 - 8.3 g/dL   Albumin 3.6  3.5 - 5.2 g/dL   Calcium 9.1  8.4 - 16.110.5 mg/dL   GFR 09.6064.97  >45.40>60.00 mL/min    Assessment/Plan: Diverticulitis of large 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  No acute flare up at present. Referral placed to General Surgery for assessment.  Hypothyroidism Medication refilled at same dose.  Will repeat TSH at physical in 2 months.

## 2014-01-06 NOTE — Assessment & Plan Note (Signed)
Medication refilled at same dose.  Will repeat TSH at physical in 2 months.

## 2014-01-06 NOTE — Patient Instructions (Signed)
Please continue current medication regimen.  Follow-up with me in December or January for your Complete Physical examination. You will be contacted by a General Surgeon for evaluation of your recurrent diverticulitis.

## 2014-02-07 ENCOUNTER — Ambulatory Visit (INDEPENDENT_AMBULATORY_CARE_PROVIDER_SITE_OTHER): Payer: 59 | Admitting: Physician Assistant

## 2014-02-07 ENCOUNTER — Encounter: Payer: Self-pay | Admitting: Physician Assistant

## 2014-02-07 VITALS — BP 128/59 | HR 60 | Temp 98.4°F | Resp 16 | Ht 66.0 in | Wt 210.1 lb

## 2014-02-07 DIAGNOSIS — K5732 Diverticulitis of large intestine without perforation or abscess without bleeding: Secondary | ICD-10-CM

## 2014-02-07 MED ORDER — METRONIDAZOLE 500 MG PO TABS: 500.0000 mg | ORAL_TABLET | Freq: Three times a day (TID) | ORAL | Status: DC

## 2014-02-07 MED ORDER — CIPROFLOXACIN HCL 500 MG PO TABS: 500.0000 mg | ORAL_TABLET | Freq: Two times a day (BID) | ORAL | Status: DC

## 2014-02-07 NOTE — Patient Instructions (Signed)
Please take antibiotics as directed until all tablets are gone.  Remember that you should not drink alcohol while on the Flagyl.  Continue high-fiber diet.  Begin a daily probiotic.  Call or return to clinic if symptoms are not resolving or if new symptoms develop. Please reconsider surgery.  Diverticulitis Diverticulitis is inflammation or infection of small pouches in your colon that form when you have a condition called diverticulosis. The pouches in your colon are called diverticula. Your colon, or large intestine, is where water is absorbed and stool is formed. Complications of diverticulitis can include:  Bleeding.  Severe infection.  Severe pain.  Perforation of your colon.  Obstruction of your colon. CAUSES  Diverticulitis is caused by bacteria. Diverticulitis happens when stool becomes trapped in diverticula. This allows bacteria to grow in the diverticula, which can lead to inflammation and infection. RISK FACTORS People with diverticulosis are at risk for diverticulitis. Eating a diet that does not include enough fiber from fruits and vegetables may make diverticulitis more likely to develop. SYMPTOMS  Symptoms of diverticulitis may include:  Abdominal pain and tenderness. The pain is normally located on the left side of the abdomen, but may occur in other areas.  Fever and chills.  Bloating.  Cramping.  Nausea.  Vomiting.  Constipation.  Diarrhea.  Blood in your stool. DIAGNOSIS  Your health care provider will ask you about your medical history and do a physical exam. You may need to have tests done because many medical conditions can cause the same symptoms as diverticulitis. Tests may include:  Blood tests.  Urine tests.  Imaging tests of the abdomen, including X-rays and CT scans. When your condition is under control, your health care provider may recommend that you have a colonoscopy. A colonoscopy can show how severe your diverticula are and whether  something else is causing your symptoms. TREATMENT  Most cases of diverticulitis are mild and can be treated at home. Treatment may include:  Taking over-the-counter pain medicines.  Following a clear liquid diet.  Taking antibiotic medicines by mouth for 7-10 days. More severe cases may be treated at a hospital. Treatment may include:  Not eating or drinking.  Taking prescription pain medicine.  Receiving antibiotic medicines through an IV tube.  Receiving fluids and nutrition through an IV tube.  Surgery. HOME CARE INSTRUCTIONS   Follow your health care provider's instructions carefully.  Follow a full liquid diet or other diet as directed by your health care provider. After your symptoms improve, your health care provider may tell you to change your diet. He or she may recommend you eat a high-fiber diet. Fruits and vegetables are good sources of fiber. Fiber makes it easier to pass stool.  Take fiber supplements or probiotics as directed by your health care provider.  Only take medicines as directed by your health care provider.  Keep all your follow-up appointments. SEEK MEDICAL CARE IF:   Your pain does not improve.  You have a hard time eating food.  Your bowel movements do not return to normal. SEEK IMMEDIATE MEDICAL CARE IF:   Your pain becomes worse.  Your symptoms do not get better.  Your symptoms suddenly get worse.  You have a fever.  You have repeated vomiting.  You have bloody or black, tarry stools. MAKE SURE YOU:   Understand these instructions.  Will watch your condition.  Will get help right away if you are not doing well or get worse. Document Released: 12/25/2004 Document Revised: 03/22/2013 Document  Reviewed: 02/09/2013 [employer] Patient Information 2015 [place]. This information is not intended to replace advice given to you by your health care provider. Make sure you discuss any questions you have with your health care  provider.

## 2014-02-07 NOTE — Progress Notes (Signed)
Patient presents to clinic today c/o LLQ pain and tenderness x 2 weeks. Patient with history of diverticulitis and malrotation of colon.  Was referred to General Surgery for recurrent diverticulitis. Surgery recommended put patient has postponed surgery for now due to upcoming vacation.  Endorses fatigue and nausea.  Denies emesis. Endorses good bowel output.    Past Medical History  Diagnosis Date  . Thyroid disease   . Hyperlipidemia   . Malrotation of colon   . Anxiety   . Palpitations   . Chicken pox   . Diverticulitis   . Cubital tunnel syndrome   . Seasonal allergies   . Malrotation of colon 2014    Appendix/entire colon on Left    Current Outpatient Prescriptions on File Prior to Visit  Medication Sig Dispense Refill  . fluticasone (FLONASE) 50 MCG/ACT nasal spray Place 1 spray into both nostrils daily.    Marland Kitchen. levothyroxine (SYNTHROID) 75 MCG tablet Take 1 tablet (75 mcg total) by mouth daily before breakfast. 90 tablet 2  . Loratadine (CLARITIN) 10 MG CAPS Take 10 mg by mouth daily.    . metoprolol tartrate (LOPRESSOR) 25 MG tablet Take 1/2 tablet twice a day 90 tablet 3  . simvastatin (ZOCOR) 20 MG tablet Take 1 tablet (20 mg total) by mouth at bedtime. 90 tablet 3   No current facility-administered medications on file prior to visit.    Allergies  Allergen Reactions  . Augmentin [Amoxicillin-Pot Clavulanate]     "gastric issues"  . Erythromycin     "Gastric issues"    Family History  Problem Relation Age of Onset  . Heart Problems Father     cardiac stents  . Hyperlipidemia Father   . Heart Problems Mother     Palpitations  . Transient ischemic attack Mother   . Diabetes Brother   . Arthritis-Osteo Father   . Arthritis-Osteo Mother   . Arthritis Sister   . Colon cancer Maternal Grandmother   . Heart disease Paternal Grandmother   . Heart disease Paternal Grandfather   . Memory loss Mother   . Gallbladder disease Brother   . Hypertension Other      History   Social History  . Marital Status: Married    Spouse Name: N/A    Number of Children: N/A  . Years of Education: N/A   Social History Main Topics  . Smoking status: Never Smoker   . Smokeless tobacco: Never Used  . Alcohol Use: Yes     Comment: 1/2 bottle wine  . Drug Use: No  . Sexual Activity: None   Other Topics Concern  . None   Social History Narrative   Review of Systems - See HPI.  All other ROS are negative.  BP 128/59 mmHg  Pulse 60  Temp(Src) 98.4 F (36.9 C) (Oral)  Resp 16  Ht 5\' 6"  (1.676 m)  Wt 210 lb 2 oz (95.312 kg)  BMI 33.93 kg/m2  SpO2 100%  Physical Exam  Constitutional: She is oriented to person, place, and time and well-developed, well-nourished, and in no distress.  HENT:  Head: Normocephalic and atraumatic.  Eyes: Conjunctivae are normal. Pupils are equal, round, and reactive to light.  Neck: Neck supple.  Cardiovascular: Normal rate, regular rhythm, normal heart sounds and intact distal pulses.   Pulmonary/Chest: Effort normal and breath sounds normal. No respiratory distress. She has no wheezes. She has no rales. She exhibits no tenderness.  Abdominal: Soft. Bowel sounds are normal. She exhibits no  distension and no mass. There is no rebound and no guarding.  + LLQ tenderness.  Neurological: She is alert and oriented to person, place, and time.  Skin: Skin is warm and dry. No rash noted.  Psychiatric: Affect normal.  Vitals reviewed.  Assessment/Plan: Diverticulitis of large intestine without perforation or abscess without bleeding Rx Cipro 500 mg BID x 7 days and Flagyl 500 mg TID x 7 days.  Increase fluids.  Continue high fiber, low fat diet. No alcohol consumption while on Flagyl.  Alarm signs and symptoms reiterated to patient.  Encouraged her to please schedule surgery as instructed by the Surgeon.  She is at risk for perforation and sepsis giving recurrent infections and malrotation of colon.

## 2014-02-07 NOTE — Progress Notes (Signed)
Pre visit review using our clinic review tool, if applicable. No additional management support is needed unless otherwise documented below in the visit note/SLS  

## 2014-02-07 NOTE — Assessment & Plan Note (Signed)
Rx Cipro 500 mg BID x 7 days and Flagyl 500 mg TID x 7 days.  Increase fluids.  Continue high fiber, low fat diet. No alcohol consumption while on Flagyl.  Alarm signs and symptoms reiterated to patient.  Encouraged her to please schedule surgery as instructed by the Surgeon.  She is at risk for perforation and sepsis giving recurrent infections and malrotation of colon.

## 2014-02-08 ENCOUNTER — Other Ambulatory Visit: Payer: Self-pay

## 2014-02-08 MED ORDER — METOPROLOL TARTRATE 25 MG PO TABS: ORAL_TABLET | ORAL | Status: DC

## 2014-02-08 MED ORDER — SIMVASTATIN 20 MG PO TABS: 20.0000 mg | ORAL_TABLET | Freq: Every day | ORAL | Status: DC

## 2014-02-14 ENCOUNTER — Other Ambulatory Visit: Payer: Self-pay | Admitting: *Deleted

## 2014-02-14 MED ORDER — METOPROLOL TARTRATE 25 MG PO TABS: ORAL_TABLET | ORAL | Status: DC

## 2014-03-06 ENCOUNTER — Ambulatory Visit (INDEPENDENT_AMBULATORY_CARE_PROVIDER_SITE_OTHER): Payer: 59 | Admitting: Physician Assistant

## 2014-03-06 ENCOUNTER — Encounter: Payer: Self-pay | Admitting: Physician Assistant

## 2014-03-06 VITALS — BP 128/80 | HR 70 | Temp 98.2°F | Ht 66.5 in | Wt 207.0 lb

## 2014-03-06 DIAGNOSIS — J00 Acute nasopharyngitis [common cold]: Secondary | ICD-10-CM

## 2014-03-06 DIAGNOSIS — Z0000 Encounter for general adult medical examination without abnormal findings: Secondary | ICD-10-CM

## 2014-03-06 DIAGNOSIS — Q433 Congenital malformations of intestinal fixation: Secondary | ICD-10-CM | POA: Insufficient documentation

## 2014-03-06 DIAGNOSIS — Z136 Encounter for screening for cardiovascular disorders: Secondary | ICD-10-CM

## 2014-03-06 DIAGNOSIS — J309 Allergic rhinitis, unspecified: Secondary | ICD-10-CM

## 2014-03-06 DIAGNOSIS — Z13820 Encounter for screening for osteoporosis: Secondary | ICD-10-CM

## 2014-03-06 DIAGNOSIS — J208 Acute bronchitis due to other specified organisms: Secondary | ICD-10-CM

## 2014-03-06 DIAGNOSIS — B9689 Other specified bacterial agents as the cause of diseases classified elsewhere: Secondary | ICD-10-CM

## 2014-03-06 HISTORY — DX: Encounter for screening for cardiovascular disorders: Z13.6

## 2014-03-06 HISTORY — DX: Congenital malformations of intestinal fixation: Q43.3

## 2014-03-06 LAB — HEPATIC FUNCTION PANEL
ALK PHOS: 59 U/L (ref 39–117)
ALT: 20 U/L (ref 0–35)
AST: 20 U/L (ref 0–37)
Albumin: 4 g/dL (ref 3.5–5.2)
BILIRUBIN DIRECT: 0.1 mg/dL (ref 0.0–0.3)
BILIRUBIN TOTAL: 1.5 mg/dL — AB (ref 0.2–1.2)
Total Protein: 7.1 g/dL (ref 6.0–8.3)

## 2014-03-06 LAB — URINALYSIS, ROUTINE W REFLEX MICROSCOPIC
Ketones, ur: NEGATIVE
NITRITE: NEGATIVE
Specific Gravity, Urine: 1.025 (ref 1.000–1.030)
Total Protein, Urine: NEGATIVE
Urine Glucose: NEGATIVE
Urobilinogen, UA: 0.2 (ref 0.0–1.0)
pH: 5.5 (ref 5.0–8.0)

## 2014-03-06 LAB — BASIC METABOLIC PANEL WITH GFR
BUN: 15 mg/dL (ref 6–23)
CALCIUM: 9.1 mg/dL (ref 8.4–10.5)
CHLORIDE: 101 meq/L (ref 96–112)
CO2: 24 mEq/L (ref 19–32)
Creat: 1.15 mg/dL — ABNORMAL HIGH (ref 0.50–1.10)
GFR, Est African American: 59 mL/min — ABNORMAL LOW
GFR, Est Non African American: 51 mL/min — ABNORMAL LOW
Glucose, Bld: 104 mg/dL — ABNORMAL HIGH (ref 70–99)
Potassium: 4 mEq/L (ref 3.5–5.3)
Sodium: 135 mEq/L (ref 135–145)

## 2014-03-06 LAB — LIPID PANEL
Cholesterol: 168 mg/dL (ref 0–200)
HDL: 33.6 mg/dL — ABNORMAL LOW (ref >39.00)
LDL Cholesterol: 104 mg/dL — ABNORMAL HIGH (ref 0–99)
NonHDL: 134.4
TRIGLYCERIDES: 154 mg/dL — AB (ref 0.0–149.0)
Total CHOL/HDL Ratio: 5
VLDL: 30.8 mg/dL (ref 0.0–40.0)

## 2014-03-06 LAB — TSH: TSH: 0.82 u[IU]/mL (ref 0.35–4.50)

## 2014-03-06 LAB — CBC
HCT: 43.4 % (ref 36.0–46.0)
HEMOGLOBIN: 14.7 g/dL (ref 12.0–15.0)
MCHC: 33.9 g/dL (ref 30.0–36.0)
MCV: 84.3 fl (ref 78.0–100.0)
Platelets: 158 10*3/uL (ref 150.0–400.0)
RBC: 5.14 Mil/uL — ABNORMAL HIGH (ref 3.87–5.11)
RDW: 13.6 % (ref 11.5–15.5)
WBC: 5.3 10*3/uL (ref 4.0–10.5)

## 2014-03-06 LAB — HEMOGLOBIN A1C: HEMOGLOBIN A1C: 6 % (ref 4.6–6.5)

## 2014-03-06 MED ORDER — LORATADINE 10 MG PO CAPS: 10.0000 mg | ORAL_CAPSULE | Freq: Every day | ORAL | Status: DC

## 2014-03-06 MED ORDER — DOXYCYCLINE HYCLATE 100 MG PO CAPS: 100.0000 mg | ORAL_CAPSULE | Freq: Two times a day (BID) | ORAL | Status: DC

## 2014-03-06 MED ORDER — HYDROCODONE-HOMATROPINE 5-1.5 MG/5ML PO SYRP
5.0000 mL | ORAL_SOLUTION | Freq: Three times a day (TID) | ORAL | Status: DC | PRN
Start: 2014-03-06 — End: 2014-05-22

## 2014-03-06 MED ORDER — FLUTICASONE PROPIONATE 50 MCG/ACT NA SUSP: 1.0000 | Freq: Every day | NASAL | Status: DC

## 2014-03-06 NOTE — Assessment & Plan Note (Signed)
Rx Doxycycline.  Increase fluids.  Rest. Rx Hycodan syrup for nighttime cough. Plain Mucinex.  Probiotic.  Humidifier in bedroom.  Return precautions discussed with patient.

## 2014-03-06 NOTE — Assessment & Plan Note (Signed)
I have reviewed the patient's medical history in detail and updated the computerized patient record. Health Maintenance up-to-date except for Bone Density scan which has been ordered. Preventive care discussed with patient and handout given.  Examination completed.  Patient to return for pelvic examination.  Will obtain fasting labs today.

## 2014-03-06 NOTE — Progress Notes (Signed)
Patient presents to clinic today for annual exam.  Patient is fasting for labs.  Acute Concerns: Patient c/o several days of worsening cough that is productive of dark green sputum.  Endorses fevers that have resolved.  Denies chest pain, shortness of breath or wheezing. Denies recent travel or sick contact. Has been taking Delsym for daytime cough and NyQuil at bedtime.  Patient requesting refills of Claritin and Flonase for her allergic rhinitis.  Health Maintenance: Dental -- up-to-date Vision -- up-to-date Immunizations -- up-to-date Colonoscopy -- up-to-date Mammogram -- up-to-date PAP -- up-to-date Bone Density -- Overdue.  Past Medical History  Diagnosis Date  . Thyroid disease   . Hyperlipidemia   . Malrotation of colon   . Anxiety   . Palpitations   . Chicken pox   . Diverticulitis   . Cubital tunnel syndrome   . Seasonal allergies   . Malrotation of colon 2014    Appendix/entire colon on Left    Past Surgical History  Procedure Laterality Date  . Tonsillectomy    . Abdominal hysterectomy    . Shoulder surgery    . Thyroidectomy, partial    . Laproscopy      Abdominal Scar TRissue  . Wrist surgery    . Knee surgery    . Breast biopsy    . Wisdom tooth extraction      Current Outpatient Prescriptions on File Prior to Visit  Medication Sig Dispense Refill  . levothyroxine (SYNTHROID) 75 MCG tablet Take 1 tablet (75 mcg total) by mouth daily before breakfast. 90 tablet 2  . metoprolol tartrate (LOPRESSOR) 25 MG tablet Take 1/2 tablet twice a day 14 tablet 0  . simvastatin (ZOCOR) 20 MG tablet Take 1 tablet (20 mg total) by mouth at bedtime. 90 tablet 1   No current facility-administered medications on file prior to visit.    Allergies  Allergen Reactions  . Augmentin [Amoxicillin-Pot Clavulanate]     "gastric issues"  . Erythromycin     "Gastric issues"    Family History  Problem Relation Age of Onset  . Heart Problems Father     cardiac  stents  . Hyperlipidemia Father   . Heart Problems Mother     Palpitations  . Transient ischemic attack Mother   . Diabetes Brother   . Arthritis-Osteo Father   . Arthritis-Osteo Mother   . Arthritis Sister   . Colon cancer Maternal Grandmother   . Heart disease Paternal Grandmother   . Heart disease Paternal Grandfather   . Memory loss Mother   . Gallbladder disease Brother   . Hypertension Other     History   Social History  . Marital Status: Married    Spouse Name: N/A    Number of Children: N/A  . Years of Education: N/A   Occupational History  . Not on file.   Social History Main Topics  . Smoking status: Never Smoker   . Smokeless tobacco: Never Used  . Alcohol Use: Yes     Comment: 1/2 bottle wine  . Drug Use: No  . Sexual Activity: Not on file   Other Topics Concern  . Not on file   Social History Narrative   Review of Systems  Constitutional: Negative for fever and weight loss.  HENT: Negative for ear discharge, ear pain, hearing loss and tinnitus.   Eyes: Negative for blurred vision, double vision, photophobia, pain, discharge and redness.  Respiratory: Positive for cough and sputum production. Negative for hemoptysis, shortness  of breath and wheezing.   Gastrointestinal: Negative for heartburn, nausea, vomiting, abdominal pain, diarrhea, constipation, blood in stool and melena.  Genitourinary: Negative for dysuria, urgency, frequency, hematuria and flank pain.  Neurological: Negative for dizziness, loss of consciousness and headaches.  Endo/Heme/Allergies: Positive for environmental allergies.  Psychiatric/Behavioral: Negative for depression, suicidal ideas, hallucinations and substance abuse. The patient is not nervous/anxious and does not have insomnia.    BP 128/80 mmHg  Pulse 70  Temp(Src) 98.2 F (36.8 C)  Ht 5' 6.5" (1.689 m)  Wt 207 lb (93.895 kg)  BMI 32.91 kg/m2  SpO2 97%  Physical Exam  Constitutional: She is oriented to person,  place, and time and well-developed, well-nourished, and in no distress.  HENT:  Head: Normocephalic and atraumatic.  Right Ear: External ear normal.  Left Ear: External ear normal.  Nose: Nose normal.  Mouth/Throat: Oropharynx is clear and moist. No oropharyngeal exudate.  TM within normal limits bilaterally.  Eyes: Conjunctivae are normal. Pupils are equal, round, and reactive to light.  Neck: Neck supple.  Cardiovascular: Normal rate, regular rhythm, normal heart sounds and intact distal pulses.   Pulmonary/Chest: Effort normal. No respiratory distress. She has no wheezes. She has no rales. She exhibits no tenderness.  Abdominal: Soft. Bowel sounds are normal. She exhibits no distension and no mass. There is no tenderness. There is no rebound and no guarding.  Lymphadenopathy:    She has no cervical adenopathy.  Neurological: She is alert and oriented to person, place, and time.  Skin: Skin is warm and dry. No rash noted.  Psychiatric: Affect normal.  Vitals reviewed.  Assessment/Plan: Acute bacterial bronchitis Rx Doxycycline.  Increase fluids.  Rest. Rx Hycodan syrup for nighttime cough. Plain Mucinex.  Probiotic.  Humidifier in bedroom.  Return precautions discussed with patient.  Visit for preventive health examination I have reviewed the patient's medical history in detail and updated the computerized patient record. Health Maintenance up-to-date except for Bone Density scan which has been ordered. Preventive care discussed with patient and handout given.  Examination completed.  Patient to return for pelvic examination.  Will obtain fasting labs today.  Screening for osteoporosis Order placed for Bone Density to assess. Encouraged multivitamin and resistance training.  Screening for ischemic heart disease EKG reveals NSR.  Discussed 81 mg ASA.  Patient will start.  Will be obtaining repeat fasting lipid panel today.

## 2014-03-06 NOTE — Patient Instructions (Signed)
Please stop by the lab for blood work.  I will call you with your results.  Please continue medications as directed.  You will be contacted to set up a scan for osteoporosis screening.  For Bronchitis -- Take antibiotic as directed.  Increase your fluid intake.  Get plenty of rest. Use Hycodan syrup for nighttime cough.  Plain Mucinex during the day.  Place a humidifier in the bedroom.  Call or return to clinic if symptoms are not improving.  Preventive Care for Adults A healthy lifestyle and preventive care can promote health and wellness. Preventive health guidelines for women include the following key practices.  A routine yearly physical is a good way to check with your health care provider about your health and preventive screening. It is a chance to share any concerns and updates on your health and to receive a thorough exam.  Visit your dentist for a routine exam and preventive care every 6 months. Brush your teeth twice a day and floss once a day. Good oral hygiene prevents tooth decay and gum disease.  The frequency of eye exams is based on your age, health, family medical history, use of contact lenses, and other factors. Follow your health care provider's recommendations for frequency of eye exams.  Eat a healthy diet. Foods like vegetables, fruits, whole grains, low-fat dairy products, and lean protein foods contain the nutrients you need without too many calories. Decrease your intake of foods high in solid fats, added sugars, and salt. Eat the right amount of calories for you.Get information about a proper diet from your health care provider, if necessary.  Regular physical exercise is one of the most important things you can do for your health. Most adults should get at least 150 minutes of moderate-intensity exercise (any activity that increases your heart rate and causes you to sweat) each week. In addition, most adults need muscle-strengthening exercises on 2 or more days a  week.  Maintain a healthy weight. The body mass index (BMI) is a screening tool to identify possible weight problems. It provides an estimate of body fat based on height and weight. Your health care provider can find your BMI and can help you achieve or maintain a healthy weight.For adults 20 years and older:  A BMI below 18.5 is considered underweight.  A BMI of 18.5 to 24.9 is normal.  A BMI of 25 to 29.9 is considered overweight.  A BMI of 30 and above is considered obese.  Maintain normal blood lipids and cholesterol levels by exercising and minimizing your intake of saturated fat. Eat a balanced diet with plenty of fruit and vegetables. Blood tests for lipids and cholesterol should begin at age 19 and be repeated every 5 years. If your lipid or cholesterol levels are high, you are over 50, or you are at high risk for heart disease, you may need your cholesterol levels checked more frequently.Ongoing high lipid and cholesterol levels should be treated with medicines if diet and exercise are not working.  If you smoke, find out from your health care provider how to quit. If you do not use tobacco, do not start.  Lung cancer screening is recommended for adults aged 75-80 years who are at high risk for developing lung cancer because of a history of smoking. A yearly low-dose CT scan of the lungs is recommended for people who have at least a 30-pack-year history of smoking and are a current smoker or have quit within the past 15 years. A  pack year of smoking is smoking an average of 1 pack of cigarettes a day for 1 year (for example: 1 pack a day for 30 years or 2 packs a day for 15 years). Yearly screening should continue until the smoker has stopped smoking for at least 15 years. Yearly screening should be stopped for people who develop a health problem that would prevent them from having lung cancer treatment.  If you are pregnant, do not drink alcohol. If you are breastfeeding, be very  cautious about drinking alcohol. If you are not pregnant and choose to drink alcohol, do not have more than 1 drink per day. One drink is considered to be 12 ounces (355 mL) of beer, 5 ounces (148 mL) of wine, or 1.5 ounces (44 mL) of liquor.  Avoid use of street drugs. Do not share needles with anyone. Ask for help if you need support or instructions about stopping the use of drugs.  High blood pressure causes heart disease and increases the risk of stroke. Your blood pressure should be checked at least every 1 to 2 years. Ongoing high blood pressure should be treated with medicines if weight loss and exercise do not work.  If you are 25-49 years old, ask your health care provider if you should take aspirin to prevent strokes.  Diabetes screening involves taking a blood sample to check your fasting blood sugar level. This should be done once every 3 years, after age 35, if you are within normal weight and without risk factors for diabetes. Testing should be considered at a younger age or be carried out more frequently if you are overweight and have at least 1 risk factor for diabetes.  Breast cancer screening is essential preventive care for women. You should practice "breast self-awareness." This means understanding the normal appearance and feel of your breasts and may include breast self-examination. Any changes detected, no matter how small, should be reported to a health care provider. Women in their 20s and 30s should have a clinical breast exam (CBE) by a health care provider as part of a regular health exam every 1 to 3 years. After age 4, women should have a CBE every year. Starting at age 89, women should consider having a mammogram (breast X-ray test) every year. Women who have a family history of breast cancer should talk to their health care provider about genetic screening. Women at a high risk of breast cancer should talk to their health care providers about having an MRI and a mammogram  every year.  Breast cancer gene (BRCA)-related cancer risk assessment is recommended for women who have family members with BRCA-related cancers. BRCA-related cancers include breast, ovarian, tubal, and peritoneal cancers. Having family members with these cancers may be associated with an increased risk for harmful changes (mutations) in the breast cancer genes BRCA1 and BRCA2. Results of the assessment will determine the need for genetic counseling and BRCA1 and BRCA2 testing.  Routine pelvic exams to screen for cancer are no longer recommended for nonpregnant women who are considered low risk for cancer of the pelvic organs (ovaries, uterus, and vagina) and who do not have symptoms. Ask your health care provider if a screening pelvic exam is right for you.  If you have had past treatment for cervical cancer or a condition that could lead to cancer, you need Pap tests and screening for cancer for at least 20 years after your treatment. If Pap tests have been discontinued, your risk factors (such as having  a new sexual partner) need to be reassessed to determine if screening should be resumed. Some women have medical problems that increase the chance of getting cervical cancer. In these cases, your health care provider may recommend more frequent screening and Pap tests.  The HPV test is an additional test that may be used for cervical cancer screening. The HPV test looks for the virus that can cause the cell changes on the cervix. The cells collected during the Pap test can be tested for HPV. The HPV test could be used to screen women aged 68 years and older, and should be used in women of any age who have unclear Pap test results. After the age of 36, women should have HPV testing at the same frequency as a Pap test.  Colorectal cancer can be detected and often prevented. Most routine colorectal cancer screening begins at the age of 57 years and continues through age 87 years. However, your health care  provider may recommend screening at an earlier age if you have risk factors for colon cancer. On a yearly basis, your health care provider may provide home test kits to check for hidden blood in the stool. Use of a small camera at the end of a tube, to directly examine the colon (sigmoidoscopy or colonoscopy), can detect the earliest forms of colorectal cancer. Talk to your health care provider about this at age 72, when routine screening begins. Direct exam of the colon should be repeated every 5-10 years through age 89 years, unless early forms of pre-cancerous polyps or small growths are found.  People who are at an increased risk for hepatitis B should be screened for this virus. You are considered at high risk for hepatitis B if:  You were born in a country where hepatitis B occurs often. Talk with your health care provider about which countries are considered high risk.  Your parents were born in a high-risk country and you have not received a shot to protect against hepatitis B (hepatitis B vaccine).  You have HIV or AIDS.  You use needles to inject street drugs.  You live with, or have sex with, someone who has hepatitis B.  You get hemodialysis treatment.  You take certain medicines for conditions like cancer, organ transplantation, and autoimmune conditions.  Hepatitis C blood testing is recommended for all people born from [birth year] through 1965 and any individual with known risks for hepatitis C.  Practice safe sex. Use condoms and avoid high-risk sexual practices to reduce the spread of sexually transmitted infections (STIs). STIs include gonorrhea, chlamydia, syphilis, trichomonas, herpes, HPV, and human immunodeficiency virus (HIV). Herpes, HIV, and HPV are viral illnesses that have no cure. They can result in disability, cancer, and death.  You should be screened for sexually transmitted illnesses (STIs) including gonorrhea and chlamydia if:  You are sexually active and are  younger than 24 years.  You are older than 24 years and your health care provider tells you that you are at risk for this type of infection.  Your sexual activity has changed since you were last screened and you are at an increased risk for chlamydia or gonorrhea. Ask your health care provider if you are at risk.  If you are at risk of being infected with HIV, it is recommended that you take a prescription medicine daily to prevent HIV infection. This is called preexposure prophylaxis (PrEP). You are considered at risk if:  You are a heterosexual woman, are sexually active, and are  at increased risk for HIV infection.  You take drugs by injection.  You are sexually active with a partner who has HIV.  Talk with your health care provider about whether you are at high risk of being infected with HIV. If you choose to begin PrEP, you should first be tested for HIV. You should then be tested every 3 months for as long as you are taking PrEP.  Osteoporosis is a disease in which the bones lose minerals and strength with aging. This can result in serious bone fractures or breaks. The risk of osteoporosis can be identified using a bone density scan. Women ages 1 years and over and women at risk for fractures or osteoporosis should discuss screening with their health care providers. Ask your health care provider whether you should take a calcium supplement or vitamin D to reduce the rate of osteoporosis.  Menopause can be associated with physical symptoms and risks. Hormone replacement therapy is available to decrease symptoms and risks. You should talk to your health care provider about whether hormone replacement therapy is right for you.  Use sunscreen. Apply sunscreen liberally and repeatedly throughout the day. You should seek shade when your shadow is shorter than you. Protect yourself by wearing long sleeves, pants, a wide-brimmed hat, and sunglasses year round, whenever you are outdoors.  Once a  month, do a whole body skin exam, using a mirror to look at the skin on your back. Tell your health care provider of new moles, moles that have irregular borders, moles that are larger than a pencil eraser, or moles that have changed in shape or color.  Stay current with required vaccines (immunizations).  Influenza vaccine. All adults should be immunized every year.  Tetanus, diphtheria, and acellular pertussis (Td, Tdap) vaccine. Pregnant women should receive 1 dose of Tdap vaccine during each pregnancy. The dose should be obtained regardless of the length of time since the last dose. Immunization is preferred during the 27th-36th week of gestation. An adult who has not previously received Tdap or who does not know her vaccine status should receive 1 dose of Tdap. This initial dose should be followed by tetanus and diphtheria toxoids (Td) booster doses every 10 years. Adults with an unknown or incomplete history of completing a 3-dose immunization series with Td-containing vaccines should begin or complete a primary immunization series including a Tdap dose. Adults should receive a Td booster every 10 years.  Varicella vaccine. An adult without evidence of immunity to varicella should receive 2 doses or a second dose if she has previously received 1 dose. Pregnant females who do not have evidence of immunity should receive the first dose after pregnancy. This first dose should be obtained before leaving the health care facility. The second dose should be obtained 4-8 weeks after the first dose.  Human papillomavirus (HPV) vaccine. Females aged 13-26 years who have not received the vaccine previously should obtain the 3-dose series. The vaccine is not recommended for use in pregnant females. However, pregnancy testing is not needed before receiving a dose. If a female is found to be pregnant after receiving a dose, no treatment is needed. In that case, the remaining doses should be delayed until after the  pregnancy. Immunization is recommended for any person with an immunocompromised condition through the age of 56 years if she did not get any or all doses earlier. During the 3-dose series, the second dose should be obtained 4-8 weeks after the first dose. The third dose should  be obtained 24 weeks after the first dose and 16 weeks after the second dose.  Zoster vaccine. One dose is recommended for adults aged 23 years or older unless certain conditions are present.  Measles, mumps, and rubella (MMR) vaccine. Adults born before [birth year] generally are considered immune to measles and mumps. Adults born in [birth year] or later should have 1 or more doses of MMR vaccine unless there is a contraindication to the vaccine or there is laboratory evidence of immunity to each of the three diseases. A routine second dose of MMR vaccine should be obtained at least 28 days after the first dose for students attending postsecondary schools, health care workers, or international travelers. People who received inactivated measles vaccine or an unknown type of measles vaccine during 1963-1967 should receive 2 doses of MMR vaccine. People who received inactivated mumps vaccine or an unknown type of mumps vaccine before 1979 and are at high risk for mumps infection should consider immunization with 2 doses of MMR vaccine. For females of childbearing age, rubella immunity should be determined. If there is no evidence of immunity, females who are not pregnant should be vaccinated. If there is no evidence of immunity, females who are pregnant should delay immunization until after pregnancy. Unvaccinated health care workers born before [birth year] who lack laboratory evidence of measles, mumps, or rubella immunity or laboratory confirmation of disease should consider measles and mumps immunization with 2 doses of MMR vaccine or rubella immunization with 1 dose of MMR vaccine.  Pneumococcal 13-valent conjugate (PCV13) vaccine. When indicated, a person  who is uncertain of her immunization history and has no record of immunization should receive the PCV13 vaccine. An adult aged 59 years or older who has certain medical conditions and has not been previously immunized should receive 1 dose of PCV13 vaccine. This PCV13 should be followed with a dose of pneumococcal polysaccharide (PPSV23) vaccine. The PPSV23 vaccine dose should be obtained at least 8 weeks after the dose of PCV13 vaccine. An adult aged 32 years or older who has certain medical conditions and previously received 1 or more doses of PPSV23 vaccine should receive 1 dose of PCV13. The PCV13 vaccine dose should be obtained 1 or more years after the last PPSV23 vaccine dose.  Pneumococcal polysaccharide (PPSV23) vaccine. When PCV13 is also indicated, PCV13 should be obtained first. All adults aged 14 years and older should be immunized. An adult younger than age 43 years who has certain medical conditions should be immunized. Any person who resides in a nursing home or long-term care facility should be immunized. An adult smoker should be immunized. People with an immunocompromised condition and certain other conditions should receive both PCV13 and PPSV23 vaccines. People with human immunodeficiency virus (HIV) infection should be immunized as soon as possible after diagnosis. Immunization during chemotherapy or radiation therapy should be avoided. Routine use of PPSV23 vaccine is not recommended for American Indians, [place] Natives, or people younger than 65 years unless there are medical conditions that require PPSV23 vaccine. When indicated, people who have unknown immunization and have no record of immunization should receive PPSV23 vaccine. One-time revaccination 5 years after the first dose of PPSV23 is recommended for people aged 19-64 years who have chronic kidney failure, nephrotic syndrome, asplenia, or immunocompromised conditions. People who received 1-2 doses of PPSV23 before age 59 years  should receive another dose of PPSV23 vaccine at age 80 years or later if at least 5 years have passed since the previous dose. Doses of PPSV23  are not needed for people immunized with PPSV23 at or after age 23 years.  Meningococcal vaccine. Adults with asplenia or persistent complement component deficiencies should receive 2 doses of quadrivalent meningococcal conjugate (MenACWY-D) vaccine. The doses should be obtained at least 2 months apart. Microbiologists working with certain meningococcal bacteria, [employer] recruits, people at risk during an outbreak, and people who travel to or live in countries with a high rate of meningitis should be immunized. A first-year college student up through age 63 years who is living in a residence hall should receive a dose if she did not receive a dose on or after her 16th birthday. Adults who have certain high-risk conditions should receive one or more doses of vaccine.  Hepatitis A vaccine. Adults who wish to be protected from this disease, have certain high-risk conditions, work with hepatitis A-infected animals, work in hepatitis A research labs, or travel to or work in countries with a high rate of hepatitis A should be immunized. Adults who were previously unvaccinated and who anticipate close contact with an international adoptee during the first 60 days after arrival in the [place] from a country with a high rate of hepatitis A should be immunized.  Hepatitis B vaccine. Adults who wish to be protected from this disease, have certain high-risk conditions, may be exposed to blood or other infectious body fluids, are household contacts or sex partners of hepatitis B positive people, are clients or workers in certain care facilities, or travel to or work in countries with a high rate of hepatitis B should be immunized.  Haemophilus influenzae type b (Hib) vaccine. A previously unvaccinated person with asplenia or sickle cell disease or having a scheduled  splenectomy should receive 1 dose of Hib vaccine. Regardless of previous immunization, a recipient of a hematopoietic stem cell transplant should receive a 3-dose series 6-12 months after her successful transplant. Hib vaccine is not recommended for adults with HIV infection. Preventive Services / Frequency Ages 56 to 62 years  Blood pressure check.** / Every 1 to 2 years.  Lipid and cholesterol check.** / Every 5 years beginning at age 41.  Clinical breast exam.** / Every 3 years for women in their 52s and 42s.  BRCA-related cancer risk assessment.** / For women who have family members with a BRCA-related cancer (breast, ovarian, tubal, or peritoneal cancers).  Pap test.** / Every 2 years from ages 37 through 92. Every 3 years starting at age 35 through age 70 or 56 with a history of 3 consecutive normal Pap tests.  HPV screening.** / Every 3 years from ages 71 through ages 25 to 34 with a history of 3 consecutive normal Pap tests.  Hepatitis C blood test.** / For any individual with known risks for hepatitis C.  Skin self-exam. / Monthly.  Influenza vaccine. / Every year.  Tetanus, diphtheria, and acellular pertussis (Tdap, Td) vaccine.** / Consult your health care provider. Pregnant women should receive 1 dose of Tdap vaccine during each pregnancy. 1 dose of Td every 10 years.  Varicella vaccine.** / Consult your health care provider. Pregnant females who do not have evidence of immunity should receive the first dose after pregnancy.  HPV vaccine. / 3 doses over 6 months, if 59 and younger. The vaccine is not recommended for use in pregnant females. However, pregnancy testing is not needed before receiving a dose.  Measles, mumps, rubella (MMR) vaccine.** / You need at least 1 dose of MMR if you were born in 1957 or later.  You may also need a 2nd dose. For females of childbearing age, rubella immunity should be determined. If there is no evidence of immunity, females who are not  pregnant should be vaccinated. If there is no evidence of immunity, females who are pregnant should delay immunization until after pregnancy.  Pneumococcal 13-valent conjugate (PCV13) vaccine.** / Consult your health care provider.  Pneumococcal polysaccharide (PPSV23) vaccine.** / 1 to 2 doses if you smoke cigarettes or if you have certain conditions.  Meningococcal vaccine.** / 1 dose if you are age 46 to 63 years and a Market researcher living in a residence hall, or have one of several medical conditions, you need to get vaccinated against meningococcal disease. You may also need additional booster doses.  Hepatitis A vaccine.** / Consult your health care provider.  Hepatitis B vaccine.** / Consult your health care provider.  Haemophilus influenzae type b (Hib) vaccine.** / Consult your health care provider. Ages 30 to 79 years  Blood pressure check.** / Every 1 to 2 years.  Lipid and cholesterol check.** / Every 5 years beginning at age 16 years.  Lung cancer screening. / Every year if you are aged 70-80 years and have a 30-pack-year history of smoking and currently smoke or have quit within the past 15 years. Yearly screening is stopped once you have quit smoking for at least 15 years or develop a health problem that would prevent you from having lung cancer treatment.  Clinical breast exam.** / Every year after age 31 years.  BRCA-related cancer risk assessment.** / For women who have family members with a BRCA-related cancer (breast, ovarian, tubal, or peritoneal cancers).  Mammogram.** / Every year beginning at age 78 years and continuing for as long as you are in good health. Consult with your health care provider.  Pap test.** / Every 3 years starting at age 38 years through age 67 or 50 years with a history of 3 consecutive normal Pap tests.  HPV screening.** / Every 3 years from ages 85 years through ages 75 to 90 years with a history of 3 consecutive normal Pap  tests.  Fecal occult blood test (FOBT) of stool. / Every year beginning at age 81 years and continuing until age 17 years. You may not need to do this test if you get a colonoscopy every 10 years.  Flexible sigmoidoscopy or colonoscopy.** / Every 5 years for a flexible sigmoidoscopy or every 10 years for a colonoscopy beginning at age 36 years and continuing until age 6 years.  Hepatitis C blood test.** / For all people born from [birth year] through 1964 and any individual with known risks for hepatitis C.  Skin self-exam. / Monthly.  Influenza vaccine. / Every year.  Tetanus, diphtheria, and acellular pertussis (Tdap/Td) vaccine.** / Consult your health care provider. Pregnant women should receive 1 dose of Tdap vaccine during each pregnancy. 1 dose of Td every 10 years.  Varicella vaccine.** / Consult your health care provider. Pregnant females who do not have evidence of immunity should receive the first dose after pregnancy.  Zoster vaccine.** / 1 dose for adults aged 22 years or older.  Measles, mumps, rubella (MMR) vaccine.** / You need at least 1 dose of MMR if you were born in 1957 or later. You may also need a 2nd dose. For females of childbearing age, rubella immunity should be determined. If there is no evidence of immunity, females who are not pregnant should be vaccinated. If there is no evidence of immunity, females  who are pregnant should delay immunization until after pregnancy.  Pneumococcal 13-valent conjugate (PCV13) vaccine.** / Consult your health care provider.  Pneumococcal polysaccharide (PPSV23) vaccine.** / 1 to 2 doses if you smoke cigarettes or if you have certain conditions.  Meningococcal vaccine.** / Consult your health care provider.  Hepatitis A vaccine.** / Consult your health care provider.  Hepatitis B vaccine.** / Consult your health care provider.  Haemophilus influenzae type b (Hib) vaccine.** / Consult your health care provider. Ages 44 years and  over  Blood pressure check.** / Every 1 to 2 years.  Lipid and cholesterol check.** / Every 5 years beginning at age 21 years.  Lung cancer screening. / Every year if you are aged 36-80 years and have a 30-pack-year history of smoking and currently smoke or have quit within the past 15 years. Yearly screening is stopped once you have quit smoking for at least 15 years or develop a health problem that would prevent you from having lung cancer treatment.  Clinical breast exam.** / Every year after age 64 years.  BRCA-related cancer risk assessment.** / For women who have family members with a BRCA-related cancer (breast, ovarian, tubal, or peritoneal cancers).  Mammogram.** / Every year beginning at age 42 years and continuing for as long as you are in good health. Consult with your health care provider.  Pap test.** / Every 3 years starting at age 100 years through age 40 or 68 years with 3 consecutive normal Pap tests. Testing can be stopped between 65 and 70 years with 3 consecutive normal Pap tests and no abnormal Pap or HPV tests in the past 10 years.  HPV screening.** / Every 3 years from ages 38 years through ages 64 or 51 years with a history of 3 consecutive normal Pap tests. Testing can be stopped between 65 and 70 years with 3 consecutive normal Pap tests and no abnormal Pap or HPV tests in the past 10 years.  Fecal occult blood test (FOBT) of stool. / Every year beginning at age 32 years and continuing until age 31 years. You may not need to do this test if you get a colonoscopy every 10 years.  Flexible sigmoidoscopy or colonoscopy.** / Every 5 years for a flexible sigmoidoscopy or every 10 years for a colonoscopy beginning at age 27 years and continuing until age 5 years.  Hepatitis C blood test.** / For all people born from [birth year] through 1964 and any individual with known risks for hepatitis C.  Osteoporosis screening.** / A one-time screening for women ages 11 years and over and  women at risk for fractures or osteoporosis.  Skin self-exam. / Monthly.  Influenza vaccine. / Every year.  Tetanus, diphtheria, and acellular pertussis (Tdap/Td) vaccine.** / 1 dose of Td every 10 years.  Varicella vaccine.** / Consult your health care provider.  Zoster vaccine.** / 1 dose for adults aged 16 years or older.  Pneumococcal 13-valent conjugate (PCV13) vaccine.** / Consult your health care provider.  Pneumococcal polysaccharide (PPSV23) vaccine.** / 1 dose for all adults aged 75 years and older.  Meningococcal vaccine.** / Consult your health care provider.  Hepatitis A vaccine.** / Consult your health care provider.  Hepatitis B vaccine.** / Consult your health care provider.  Haemophilus influenzae type b (Hib) vaccine.** / Consult your health care provider. ** Family history and personal history of risk and conditions may change your health care provider's recommendations. Document Released: 05/13/2001 Document Revised: 08/01/2013 Document Reviewed: 08/12/2010 Exit[employer] Patient Information 2015  [employer]. This information is not intended to replace advice given to you by your health care provider. Make sure you discuss any questions you have with your health care provider.

## 2014-03-06 NOTE — Assessment & Plan Note (Signed)
EKG reveals NSR.  Discussed 81 mg ASA.  Patient will start.  Will be obtaining repeat fasting lipid panel today.

## 2014-03-06 NOTE — Assessment & Plan Note (Signed)
Order placed for Bone Density to assess. Encouraged multivitamin and resistance training.

## 2014-03-06 NOTE — Progress Notes (Signed)
Pre visit review using our clinic review tool, if applicable. No additional management support is needed unless otherwise documented below in the visit note. 

## 2014-03-07 ENCOUNTER — Telehealth: Payer: Self-pay | Admitting: Physician Assistant

## 2014-03-07 NOTE — Telephone Encounter (Signed)
Labs good overall.  Continue medications as directed.  Triglycerides elevated.  Add on a fish oil supplement daily. Her urine shows evidence of a bad catch.  If she is having any urinary symptoms, she should return to lab for a repeat UA.  If no symptoms, nothing further needed.  Follow-up in 6 months.

## 2014-03-07 NOTE — Telephone Encounter (Signed)
Pt notified of results, pt verbalized understanding . Pt denies urinary sx's .

## 2014-03-09 ENCOUNTER — Telehealth: Payer: Self-pay | Admitting: Physician Assistant

## 2014-03-09 MED ORDER — PROMETHAZINE-DM 6.25-15 MG/5ML PO SYRP: 5.0000 mL | ORAL_SOLUTION | Freq: Four times a day (QID) | ORAL | Status: DC | PRN

## 2014-03-09 NOTE — Telephone Encounter (Signed)
Caller name: Spencer Relation to pt: self Call back number: [phone] Pharmacy: [facility] [address]  Reason for call:   Patient states that her cough is not better at all and would like something else called in. Patient states that when she lays down you can hear "snap,crackle, and pop"

## 2014-03-09 NOTE — Telephone Encounter (Signed)
Promethazine-DM cough syrup given to help calm cough.  Take as directed.  Continue the antibiotic as directed until all tablets are gone.  Will take a while for the cough to completely subside while airways are healing.

## 2014-03-09 NOTE — Telephone Encounter (Signed)
Informed patient of this.  °

## 2014-04-03 ENCOUNTER — Ambulatory Visit (INDEPENDENT_AMBULATORY_CARE_PROVIDER_SITE_OTHER)
Admission: RE | Admit: 2014-04-03 | Discharge: 2014-04-03 | Disposition: A | Discharge status: Home / Self Care | Payer: 59 | Source: Ambulatory Visit | Attending: Physician Assistant | Admitting: Physician Assistant

## 2014-04-03 DIAGNOSIS — Z13820 Encounter for screening for osteoporosis: Secondary | ICD-10-CM

## 2014-04-12 ENCOUNTER — Telehealth: Payer: Self-pay | Admitting: Physician Assistant

## 2014-04-12 DIAGNOSIS — M8580 Other specified disorders of bone density and structure, unspecified site: Secondary | ICD-10-CM

## 2014-04-12 MED ORDER — CALCIUM + D3 600-200 MG-UNIT PO TABS: 1.0000 | ORAL_TABLET | Freq: Two times a day (BID) | ORAL | Status: DC

## 2014-04-12 NOTE — Telephone Encounter (Signed)
Patient informed, understood and agreed

## 2014-04-12 NOTE — Telephone Encounter (Addendum)
Bone Density scan reveals osteopenia.  Would like for her to start Calcium-Vit D supplement.  Rx sent to pharmacy.  Take as directed.  Will repeat Density in 2 years. Will check Vitamin D at next visit.

## 2014-05-22 ENCOUNTER — Encounter: Payer: Self-pay | Admitting: Family Medicine

## 2014-05-22 ENCOUNTER — Ambulatory Visit (INDEPENDENT_AMBULATORY_CARE_PROVIDER_SITE_OTHER): Payer: 59 | Admitting: Family Medicine

## 2014-05-22 VITALS — BP 122/72 | HR 75 | Temp 98.7°F | Ht 66.0 in | Wt 211.1 lb

## 2014-05-22 DIAGNOSIS — R05 Cough: Secondary | ICD-10-CM

## 2014-05-22 DIAGNOSIS — J1189 Influenza due to unidentified influenza virus with other manifestations: Secondary | ICD-10-CM

## 2014-05-22 DIAGNOSIS — R059 Cough, unspecified: Secondary | ICD-10-CM

## 2014-05-22 DIAGNOSIS — J111 Influenza due to unidentified influenza virus with other respiratory manifestations: Secondary | ICD-10-CM

## 2014-05-22 LAB — POCT INFLUENZA A/B: Influenza A, POC: NEGATIVE

## 2014-05-22 MED ORDER — GUAIFENESIN-CODEINE 100-10 MG/5ML PO SYRP: 5.0000 mL | ORAL_SOLUTION | Freq: Three times a day (TID) | ORAL | Status: DC | PRN

## 2014-05-22 MED ORDER — OSELTAMIVIR PHOSPHATE 75 MG PO CAPS
75.0000 mg | ORAL_CAPSULE | Freq: Two times a day (BID) | ORAL | Status: DC
Start: 2014-05-22 — End: 2014-07-27

## 2014-05-22 NOTE — Patient Instructions (Signed)

## 2014-05-22 NOTE — Progress Notes (Signed)
Pre visit review using our clinic review tool, if applicable. No additional management support is needed unless otherwise documented below in the visit note. 

## 2014-05-22 NOTE — Progress Notes (Signed)
  SDiscussed diagnosis and treatment of URI. Suggested symptomatic OTC remedies. Nasal saline spray for congestion. Follow up as needed. subjective:     Courtney Valenzuela is a 64 y.o. female who presents for evaluation of symptoms of a URI. Symptoms include achiness, congestion, fever 101, nasal congestion, non productive cough and sore throat. Onset of symptoms was 2 days ago, and has been gradually worsening since that time. Treatment to date: none.  The following portions of the patient's history were reviewed and updated as appropriate: allergies, current medications, past family history, past medical history, past social history, past surgical history and problem list.  Review of Systems Pertinent items are noted in HPI.   Objective:    BP 122/72 mmHg  Pulse 75  Temp(Src) 98.7 F (37.1 C) (Oral)  Ht 5\' 6"  (1.676 m)  Wt 211 lb 2 oz (95.766 kg)  BMI 34.09 kg/m2  SpO2 96% General appearance: alert, cooperative, appears stated age and no distress Ears: normal TM's and external ear canals both ears Nose: clear discharge, moderate congestion, turbinates red, swollen Throat: abnormal findings: mild oropharyngeal erythema Neck: no adenopathy and thyroid not enlarged, symmetric, no tenderness/mass/nodules Lungs: clear to auscultation bilaterally Heart: S1, S2 normal   Assessment:    influenza and viral upper respiratory illness   Plan:   tamiflu for 5 daysDiscussed diagnosis and treatment of URI. Suggested symptomatic OTC remedies. Nasal saline spray for congestion. Follow up as needed. tamiflu for 5 days

## 2014-06-20 ENCOUNTER — Other Ambulatory Visit: Payer: Self-pay | Admitting: General Surgery

## 2014-06-20 MED ORDER — GENTAMICIN SULFATE 40 MG/ML IJ SOLN: 5.0000 mg/kg | INTRAVENOUS | Status: DC

## 2014-06-20 MED ORDER — DEXTROSE 5 % IV SOLN: 900.0000 mg | INTRAVENOUS | Status: DC

## 2014-06-20 NOTE — H&P (Signed)
Courtney Valenzuela. Courtney Valenzuela 06/20/2014 11:21 AM Location: [facility] Patient #: [id number] DOB: 05-08-1950 Married / Language: English / Race: White Female History of Present Illness Courtney Levee MD; 06/20/2014 1:12 PM) Patient words: malrotation of colon.  The patient is a 64 year old female who presents with diverticulitis. The patient states this has been ocurring for 2-3 years with an epsiode every 6 months. Her last episode was in the summer. She has never been hospitalized. She states she had a screening colonoscopy in 2004 which was incomplete due to tumor colon being all of the left side. She had a barium enema that was apparently unremarkable. In 2012 she developed left lower quadrant pain with a fever while she was living in [place]. Reportedly CT scan showed diverticulitis and she was treated with Cipro and Flagyl with resolution. She had a followup colonoscopy which was normal except for the diverticular disease. In 2014 she had another CT proven about of diverticulitis again treated with Cipro Flagyl. At this time she developed recurrent symptoms for several weeks with a CT scan showing continued diverticulitis at the proximal sigmoid colon on the left side. She was switched to Levaquin from Cipro Flagyl and her diverticulitis resolved. Since that time she has had one other episode a few months ago which resolved with Levaquin as well. She sees Dr. Nicholas [facility] [place] [facility] Valenzuela in [place] for this. Her last colonoscopy was in July of 2014. This showed a polyp in the ascending colon as well as moderate diverticulosis of the whole colon and internal hemorrhoids. 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 I saw her back in October and recommended surgery. She decided against it at that time. She continues to have ongoing left lower quadrant pain in the same area no diverticulitis was seen on CT scan. Antibiotics resolved the pain temporarily but returns shortly after finishing  them. Other Problems Courtney Levee, MD; 06/20/2014 1:14 PM) Arthritis Asthma Back Pain Cholelithiasis Diverticulosis Hemorrhoids Hypercholesterolemia Lump In Breast Oophorectomy Bilateral. Thyroid Disease DIVERTICULITIS OF SMALL INTESTINE WITHOUT PERFORATION OR ABSCESS WITHOUT BLEEDING (562.01  K57.12) DIVERTICULITIS OF INTESTINE WITH PERFORATION WITHOUT BLEEDING, UNSPECIFIED PART OF INTESTINAL TRACT (562.11  K57.80) Patient with recurrent episodes of diverticulitis over the past 3 yrs. She has not been hospitalized for this in the past. It has resolved each time with antibiotics.  Past Surgical History Courtney Levee, MD; 06/20/2014 1:14 PM) Breast Biopsy Bilateral. multiple Colon Polyp Removal - Colonoscopy Hysterectomy (not due to cancer) - Complete Knee Surgery Right. Oral Surgery Shoulder Surgery Left. Thyroid Surgery Tonsillectomy  Diagnostic Studies History Courtney Levee, MD; 06/20/2014 1:15 PM) Colonoscopy 1-5 years ago Mammogram 1-3 years ago Pap Smear 1-5 years ago  Allergies Courtney Bickers, LPN; 0/45/4098 11:91 AM) Erythromycin Derivatives Augmentin *PENICILLINS*  Medication History Courtney Bickers, LPN; 4/78/2956 21:30 AM) Synthroid ( Tablet, Oral daily) Active. Flonase Allergy Relief (50MCG/ACT Suspension, Nasal) Active. Simvastatin (  Tablet, Oral daily) Active. Medications Reconciled  Social History Courtney Levee, MD; 06/20/2014 1:15 PM) Alcohol use Moderate alcohol use. Caffeine use Carbonated beverages, Coffee, Tea. No drug use Tobacco use Never smoker.  Family History Courtney Levee, MD; 06/20/2014 1:15 PM) Arthritis Father, Mother, Sister. Colon Cancer Family Members In General. Diabetes Mellitus Brother. Heart Disease Father. Hypertension Brother, Father, Mother, Sister. Seizure disorder Sister.    Vitals Courtney Endo Dockery LPN; 8/65/7846 96:29 AM) 06/20/2014 11:22 AM Weight: 210.38 lb Height:  66in Body Surface Area: 2.11 m Body Mass Index: 33.96 kg/m Temp.: 98.15F  Pulse: 60 (Regular)  BP: 132/80 (  Sitting, Left Arm, Standard)     Physical Exam Courtney Valenzuela(Courtney Valladares MD; 06/20/2014 1:15 PM)  General Mental Status-Alert. General Appearance-Consistent with stated age. Hydration-Well hydrated. Voice-Normal.  Head and Neck Head-normocephalic, atraumatic with no lesions or palpable masses. Trachea-midline.  Chest and Lung Exam Chest and lung exam reveals -quiet, even and easy respiratory effort with no use of accessory muscles and on auscultation, normal breath sounds, no adventitious sounds and normal vocal resonance. Inspection Chest Wall - Normal. Back - normal.  Cardiovascular Cardiovascular examination reveals -normal heart sounds, regular rate and rhythm with no murmurs.  Abdomen Inspection Inspection of the abdomen reveals - No Hernias. Skin - Scar - Note: lower midline surgical scar. Palpation/Percussion Palpation and Percussion of the abdomen reveal - Soft, Non Tender, No Rebound tenderness, No Rigidity (guarding) and No hepatosplenomegaly. Auscultation Auscultation of the abdomen reveals - Bowel sounds normal.  Neurologic Neurologic evaluation reveals -alert and oriented x 3 with no impairment of recent or remote memory. Mental Status-Normal.    Assessment & Plan Courtney Valenzuela(Courtney Kestler MD; 06/20/2014 1:14 PM)  DIVERTICULITIS OF SMALL INTESTINE WITHOUT PERFORATION OR ABSCESS WITHOUT BLEEDING (562.01  K57.12) Impression: Patient with recurrent episodes of diverticulitis over the past 3 yrs. She has not been hospitalized for this in the past. It has resolved each time with antibiotics except for some smoldering pain in her LLQ that is persistent despite therapy. This corresponds to the area of inflammation seen on CT. I have once again recommended surgical resection. She would like to proceed with this at this time. The surgery and anatomy  were described to the patient as well as the risks of surgery and the possible complications. These include: Bleeding, deep abdominal infections and possible wound complications such as hernia and infection, damage to adjacent structures, leak of surgical connections, which can lead to other surgeries and possibly an ostomy, possible need for other procedures, such as abscess drains in radiology, possible prolonged hospital stay, possible diarrhea from removal of part of the colon, possible constipation from narcotics, possible bowel, bladder or sexual dysfunction if having rectal surgery, prolonged fatigue/weakness or appetite loss, possible early recurrence of of disease, possible complications of their medical problems such as heart disease or arrhythmias or lung problems, death (less than 1%). I believe the patient understands and wishes to proceed with the surgery.  Current Plans Pt Education - AT Colonoscopy Bowel Prep

## 2014-07-15 ENCOUNTER — Other Ambulatory Visit: Payer: Self-pay | Admitting: Cardiology

## 2014-07-17 NOTE — Patient Instructions (Addendum)
YOUR PROCEDURE IS SCHEDULED ON :  07/24/14  REPORT TO [facility] MAIN ENTRANCE FOLLOW SIGNS TO SHORT STAY CENTER AT :  5:30 AM  CALL THIS NUMBER IF YOU HAVE PROBLEMS THE MORNING OF SURGERY [phone]  REMEMBER:  DO NOT EAT FOOD OR DRINK LIQUIDS AFTER MIDNIGHT  TAKE THESE MEDICINES THE MORNING OF SURGERY: LEVOTHYROXINE / METOPROLOL  YOU MAY NOT HAVE ANY METAL ON YOUR BODY INCLUDING HAIR PINS AND PIERCING'S. DO NOT WEAR JEWELRY, MAKEUP, LOTIONS, POWDERS OR PERFUMES. DO NOT WEAR NAIL POLISH. DO NOT SHAVE 48 HRS PRIOR TO SURGERY. MEN MAY SHAVE FACE AND NECK.  DO NOT BRING VALUABLES TO HOSPITAL. [facility] IS NOT RESPONSIBLE FOR VALUABLES.  CONTACTS, DENTURES OR PARTIALS MAY NOT BE WORN TO SURGERY. LEAVE SUITCASE IN CAR. CAN BE BROUGHT TO ROOM AFTER SURGERY.  PATIENTS DISCHARGED THE DAY OF SURGERY WILL NOT BE ALLOWED TO DRIVE HOME.  PLEASE READ OVER THE FOLLOWING INSTRUCTION SHEETS _________________________________________________________________________________                                          [place] - PREPARING FOR SURGERY  Before surgery, you can play an important role.  Because skin is not sterile, your skin needs to be as free of germs as possible.  You can reduce the number of germs on your skin by washing with CHG (chlorahexidine gluconate) soap before surgery.  CHG is an antiseptic cleaner which kills germs and bonds with the skin to continue killing germs even after washing. Please DO NOT use if you have an allergy to CHG or antibacterial soaps.  If your skin becomes reddened/irritated stop using the CHG and inform your nurse when you arrive at Short Stay. Do not shave (including legs and underarms) for at least 48 hours prior to the first CHG shower.  You may shave your face. Please follow these instructions carefully:   1.  Shower with CHG Soap the night before surgery and the  morning of Surgery.   2.  If you choose to wash your hair, wash  your hair first as usual with your  normal  Shampoo.   3.  After you shampoo, rinse your hair and body thoroughly to remove the  shampoo.                                         4.  Use CHG as you would any other liquid soap.  You can apply chg directly  to the skin and wash . Gently wash with scrungie or clean wascloth    5.  Apply the CHG Soap to your body ONLY FROM THE NECK DOWN.   Do not use on open                           Wound or open sores. Avoid contact with eyes, ears mouth and genitals (private parts).                        Genitals (private parts) with your normal soap.              6.  Wash thoroughly, paying special attention to the area where your surgery  will be performed.  7.  Thoroughly rinse your body with warm water from the neck down.   8.  DO NOT shower/wash with your normal soap after using and rinsing off  the CHG Soap .                9.  Pat yourself dry with a clean towel.             10.  Wear clean night clothes to bed after shower             11.  Place clean sheets on your bed the night of your first shower and do not  sleep with pets.  Day of Surgery : Do not apply any lotions/deodorants the morning of surgery.  Please wear clean clothes to the hospital/surgery center.  FAILURE TO FOLLOW THESE INSTRUCTIONS MAY RESULT IN THE CANCELLATION OF YOUR SURGERY    PATIENT SIGNATURE_________________________________  ______________________________________________________________________    CLEAR LIQUID DIET   Foods Allowed                                                                     Foods Excluded  Coffee and tea, regular and decaf                             liquids that you cannot  Plain Jell-O in any flavor                                             see through such as: Fruit ices (not with fruit pulp)                                     milk, soups, orange juice  Iced Popsicles                                                 All solid  food Carbonated beverages, regular and diet                                    Cranberry, grape and apple juices Sports drinks like Gatorade Lightly seasoned clear broth or consume(fat free) Sugar, honey syrup   _____________________________________________________________________

## 2014-07-18 ENCOUNTER — Encounter (HOSPITAL_COMMUNITY)
Admission: RE | Admit: 2014-07-18 | Discharge: 2014-07-18 | Disposition: A | Discharge status: Home / Self Care | Payer: 59 | Source: Ambulatory Visit | Attending: General Surgery | Admitting: General Surgery

## 2014-07-18 ENCOUNTER — Encounter (HOSPITAL_COMMUNITY): Payer: Self-pay

## 2014-07-18 DIAGNOSIS — J45909 Unspecified asthma, uncomplicated: Secondary | ICD-10-CM | POA: Insufficient documentation

## 2014-07-18 DIAGNOSIS — K5790 Diverticulosis of intestine, part unspecified, without perforation or abscess without bleeding: Secondary | ICD-10-CM | POA: Diagnosis not present

## 2014-07-18 DIAGNOSIS — Z0183 Encounter for blood typing: Secondary | ICD-10-CM | POA: Insufficient documentation

## 2014-07-18 DIAGNOSIS — E780 Pure hypercholesterolemia: Secondary | ICD-10-CM | POA: Diagnosis not present

## 2014-07-18 DIAGNOSIS — Z880 Allergy status to penicillin: Secondary | ICD-10-CM | POA: Insufficient documentation

## 2014-07-18 DIAGNOSIS — Z01812 Encounter for preprocedural laboratory examination: Secondary | ICD-10-CM | POA: Diagnosis not present

## 2014-07-18 DIAGNOSIS — Z79899 Other long term (current) drug therapy: Secondary | ICD-10-CM | POA: Diagnosis not present

## 2014-07-18 DIAGNOSIS — R1032 Left lower quadrant pain: Secondary | ICD-10-CM | POA: Insufficient documentation

## 2014-07-18 HISTORY — DX: Unspecified osteoarthritis, unspecified site: M19.90

## 2014-07-18 HISTORY — DX: Essential (primary) hypertension: I10

## 2014-07-18 HISTORY — DX: Pain in left shoulder: M25.512

## 2014-07-18 HISTORY — DX: Unspecified asthma, uncomplicated: J45.909

## 2014-07-18 LAB — BASIC METABOLIC PANEL
ANION GAP: 5 (ref 5–15)
BUN: 17 mg/dL (ref 6–23)
CHLORIDE: 108 mmol/L (ref 96–112)
CO2: 26 mmol/L (ref 19–32)
CREATININE: 0.89 mg/dL (ref 0.50–1.10)
Calcium: 9.1 mg/dL (ref 8.4–10.5)
GFR calc non Af Amer: 68 mL/min — ABNORMAL LOW (ref >90)
GFR, EST AFRICAN AMERICAN: 79 mL/min — AB (ref >90)
Glucose, Bld: 98 mg/dL (ref 70–99)
POTASSIUM: 4.7 mmol/L (ref 3.5–5.1)
SODIUM: 139 mmol/L (ref 135–145)

## 2014-07-18 LAB — CBC
HEMATOCRIT: 43.3 % (ref 36.0–46.0)
HEMOGLOBIN: 14.1 g/dL (ref 12.0–15.0)
MCH: 28.4 pg (ref 26.0–34.0)
MCHC: 32.6 g/dL (ref 30.0–36.0)
MCV: 87.1 fL (ref 78.0–100.0)
Platelets: 182 10*3/uL (ref 150–400)
RBC: 4.97 MIL/uL (ref 3.87–5.11)
RDW: 13.5 % (ref 11.5–15.5)
WBC: 7.7 10*3/uL (ref 4.0–10.5)

## 2014-07-18 LAB — ABO/RH: ABO/RH(D): O POS

## 2014-07-19 LAB — HEMOGLOBIN A1C
Hgb A1c MFr Bld: 5.8 % — ABNORMAL HIGH (ref 4.8–5.6)
Mean Plasma Glucose: 120 mg/dL

## 2014-07-24 ENCOUNTER — Inpatient Hospital Stay (HOSPITAL_COMMUNITY)
Admission: RE | Admit: 2014-07-24 | Discharge: 2014-07-27 | DRG: 331 | Disposition: A | Discharge status: Home / Self Care | Payer: 59 | Source: Ambulatory Visit | Attending: General Surgery | Admitting: General Surgery

## 2014-07-24 ENCOUNTER — Inpatient Hospital Stay (HOSPITAL_COMMUNITY): Payer: 59 | Admitting: Certified Registered"

## 2014-07-24 ENCOUNTER — Encounter (HOSPITAL_COMMUNITY): Payer: Self-pay | Admitting: *Deleted

## 2014-07-24 ENCOUNTER — Encounter (HOSPITAL_COMMUNITY)
Admission: RE | Disposition: A | Discharge status: Home / Self Care | Payer: Self-pay | Source: Ambulatory Visit | Attending: General Surgery

## 2014-07-24 DIAGNOSIS — Z881 Allergy status to other antibiotic agents status: Secondary | ICD-10-CM

## 2014-07-24 DIAGNOSIS — J45909 Unspecified asthma, uncomplicated: Secondary | ICD-10-CM | POA: Diagnosis present

## 2014-07-24 DIAGNOSIS — Z90710 Acquired absence of both cervix and uterus: Secondary | ICD-10-CM

## 2014-07-24 DIAGNOSIS — K5790 Diverticulosis of intestine, part unspecified, without perforation or abscess without bleeding: Secondary | ICD-10-CM

## 2014-07-24 DIAGNOSIS — K5792 Diverticulitis of intestine, part unspecified, without perforation or abscess without bleeding: Principal | ICD-10-CM | POA: Diagnosis present

## 2014-07-24 DIAGNOSIS — E785 Hyperlipidemia, unspecified: Secondary | ICD-10-CM | POA: Diagnosis present

## 2014-07-24 DIAGNOSIS — E039 Hypothyroidism, unspecified: Secondary | ICD-10-CM | POA: Diagnosis present

## 2014-07-24 DIAGNOSIS — K660 Peritoneal adhesions (postprocedural) (postinfection): Secondary | ICD-10-CM | POA: Diagnosis present

## 2014-07-24 DIAGNOSIS — N736 Female pelvic peritoneal adhesions (postinfective): Secondary | ICD-10-CM | POA: Diagnosis present

## 2014-07-24 HISTORY — DX: Diverticulosis of intestine, part unspecified, without perforation or abscess without bleeding: K57.90

## 2014-07-24 HISTORY — PX: PROCTOSCOPY: SHX2266

## 2014-07-24 LAB — TYPE AND SCREEN
ABO/RH(D): O POS
Antibody Screen: NEGATIVE

## 2014-07-24 SURGERY — COLECTOMY, PARTIAL, ROBOT-ASSISTED, LAPAROSCOPIC
Anesthesia: General | Site: Abdomen

## 2014-07-24 MED ORDER — MEPERIDINE HCL 50 MG/ML IJ SOLN: 6.2500 mg | INTRAMUSCULAR | Status: DC | PRN

## 2014-07-24 MED ORDER — HYDROMORPHONE HCL 1 MG/ML IJ SOLN
0.2500 mg | INTRAMUSCULAR | Status: DC | PRN
  Administered 2014-07-24 (×4): 0.5 mg via INTRAVENOUS

## 2014-07-24 MED ORDER — ENOXAPARIN SODIUM 40 MG/0.4ML ~~LOC~~ SOLN
40.0000 mg | SUBCUTANEOUS | Status: DC
  Administered 2014-07-25 – 2014-07-27 (×3): 40 mg via SUBCUTANEOUS
  Filled 2014-07-24 (×4): qty 0.4

## 2014-07-24 MED ORDER — HYDROMORPHONE HCL 2 MG/ML IJ SOLN
INTRAMUSCULAR | Status: AC
  Filled 2014-07-24: qty 1

## 2014-07-24 MED ORDER — BUPIVACAINE-EPINEPHRINE 0.25% -1:200000 IJ SOLN
INTRAMUSCULAR | Status: DC | PRN
  Administered 2014-07-24: 20 mL

## 2014-07-24 MED ORDER — LEVOTHYROXINE SODIUM 75 MCG PO TABS
75.0000 ug | ORAL_TABLET | Freq: Every day | ORAL | Status: DC
  Administered 2014-07-25 – 2014-07-27 (×3): 75 ug via ORAL
  Filled 2014-07-24 (×4): qty 1

## 2014-07-24 MED ORDER — PROMETHAZINE HCL 25 MG/ML IJ SOLN
INTRAMUSCULAR | Status: AC
  Filled 2014-07-24: qty 1

## 2014-07-24 MED ORDER — CETYLPYRIDINIUM CHLORIDE 0.05 % MT LIQD
7.0000 mL | Freq: Two times a day (BID) | OROMUCOSAL | Status: DC
  Administered 2014-07-24 – 2014-07-25 (×2): 7 mL via OROMUCOSAL

## 2014-07-24 MED ORDER — LIDOCAINE HCL (CARDIAC) 20 MG/ML IV SOLN
INTRAVENOUS | Status: DC | PRN
  Administered 2014-07-24: 50 mg via INTRAVENOUS

## 2014-07-24 MED ORDER — NEOSTIGMINE METHYLSULFATE 10 MG/10ML IV SOLN
INTRAVENOUS | Status: AC
  Filled 2014-07-24: qty 1

## 2014-07-24 MED ORDER — ROCURONIUM BROMIDE 100 MG/10ML IV SOLN
INTRAVENOUS | Status: AC
  Filled 2014-07-24: qty 1

## 2014-07-24 MED ORDER — ONDANSETRON HCL 4 MG/2ML IJ SOLN
INTRAMUSCULAR | Status: AC
  Filled 2014-07-24: qty 2

## 2014-07-24 MED ORDER — GLYCOPYRROLATE 0.2 MG/ML IJ SOLN
INTRAMUSCULAR | Status: AC
  Filled 2014-07-24: qty 3

## 2014-07-24 MED ORDER — LIDOCAINE HCL (CARDIAC) 20 MG/ML IV SOLN
INTRAVENOUS | Status: AC
  Filled 2014-07-24: qty 5

## 2014-07-24 MED ORDER — ALUM & MAG HYDROXIDE-SIMETH 200-200-20 MG/5ML PO SUSP: 30.0000 mL | Freq: Four times a day (QID) | ORAL | Status: DC | PRN

## 2014-07-24 MED ORDER — SIMVASTATIN 20 MG PO TABS
20.0000 mg | ORAL_TABLET | Freq: Every day | ORAL | Status: DC
  Administered 2014-07-24 – 2014-07-26 (×3): 20 mg via ORAL
  Filled 2014-07-24 (×4): qty 1

## 2014-07-24 MED ORDER — SODIUM CHLORIDE 0.9 % IJ SOLN
INTRAMUSCULAR | Status: AC
  Filled 2014-07-24: qty 10

## 2014-07-24 MED ORDER — HYDROMORPHONE HCL 1 MG/ML IJ SOLN
INTRAMUSCULAR | Status: AC
  Filled 2014-07-24: qty 2

## 2014-07-24 MED ORDER — DEXTROSE 5 % IV SOLN
2.0000 g | Freq: Two times a day (BID) | INTRAVENOUS | Status: AC
  Administered 2014-07-24: 2 g via INTRAVENOUS
  Filled 2014-07-24: qty 2

## 2014-07-24 MED ORDER — MIDAZOLAM HCL 5 MG/5ML IJ SOLN
INTRAMUSCULAR | Status: DC | PRN
  Administered 2014-07-24: 2 mg via INTRAVENOUS

## 2014-07-24 MED ORDER — LIP MEDEX EX OINT
TOPICAL_OINTMENT | CUTANEOUS | Status: AC
  Administered 2014-07-24: 12:00:00
  Filled 2014-07-24: qty 7

## 2014-07-24 MED ORDER — FENTANYL CITRATE (PF) 100 MCG/2ML IJ SOLN
INTRAMUSCULAR | Status: DC | PRN
  Administered 2014-07-24 (×2): 50 ug via INTRAVENOUS
  Administered 2014-07-24: 100 ug via INTRAVENOUS
  Administered 2014-07-24: 50 ug via INTRAVENOUS

## 2014-07-24 MED ORDER — ALVIMOPAN 12 MG PO CAPS
12.0000 mg | ORAL_CAPSULE | Freq: Once | ORAL | Status: AC
  Administered 2014-07-24: 12 mg via ORAL
  Filled 2014-07-24: qty 1

## 2014-07-24 MED ORDER — FENTANYL CITRATE (PF) 250 MCG/5ML IJ SOLN
INTRAMUSCULAR | Status: AC
  Filled 2014-07-24: qty 5

## 2014-07-24 MED ORDER — NEOSTIGMINE METHYLSULFATE 10 MG/10ML IV SOLN
INTRAVENOUS | Status: DC | PRN
  Administered 2014-07-24: 4 mg via INTRAVENOUS

## 2014-07-24 MED ORDER — DIPHENHYDRAMINE HCL 50 MG/ML IJ SOLN: 12.5000 mg | Freq: Four times a day (QID) | INTRAMUSCULAR | Status: DC | PRN

## 2014-07-24 MED ORDER — ONDANSETRON HCL 4 MG/2ML IJ SOLN: 4.0000 mg | Freq: Four times a day (QID) | INTRAMUSCULAR | Status: DC | PRN

## 2014-07-24 MED ORDER — LACTATED RINGERS IR SOLN
Status: DC | PRN
  Administered 2014-07-24: 1000 mL

## 2014-07-24 MED ORDER — LACTATED RINGERS IV SOLN: INTRAVENOUS | Status: DC

## 2014-07-24 MED ORDER — ALVIMOPAN 12 MG PO CAPS
12.0000 mg | ORAL_CAPSULE | Freq: Two times a day (BID) | ORAL | Status: DC
  Administered 2014-07-25 – 2014-07-26 (×4): 12 mg via ORAL
  Filled 2014-07-24 (×6): qty 1

## 2014-07-24 MED ORDER — PROMETHAZINE HCL 25 MG/ML IJ SOLN
6.2500 mg | INTRAMUSCULAR | Status: DC | PRN
  Administered 2014-07-24: 12.5 mg via INTRAVENOUS

## 2014-07-24 MED ORDER — LACTATED RINGERS IV SOLN
INTRAVENOUS | Status: DC | PRN
  Administered 2014-07-24 (×4): via INTRAVENOUS

## 2014-07-24 MED ORDER — 0.9 % SODIUM CHLORIDE (POUR BTL) OPTIME
TOPICAL | Status: DC | PRN
  Administered 2014-07-24: 2000 mL

## 2014-07-24 MED ORDER — SUCCINYLCHOLINE CHLORIDE 20 MG/ML IJ SOLN
INTRAMUSCULAR | Status: DC | PRN
  Administered 2014-07-24: 100 mg via INTRAVENOUS

## 2014-07-24 MED ORDER — ONDANSETRON HCL 4 MG/2ML IJ SOLN
INTRAMUSCULAR | Status: DC | PRN
  Administered 2014-07-24: 4 mg via INTRAVENOUS

## 2014-07-24 MED ORDER — ROCURONIUM BROMIDE 100 MG/10ML IV SOLN
INTRAVENOUS | Status: DC | PRN
  Administered 2014-07-24: 35 mg via INTRAVENOUS
  Administered 2014-07-24 (×2): 10 mg via INTRAVENOUS
  Administered 2014-07-24: 20 mg via INTRAVENOUS
  Administered 2014-07-24: 5 mg via INTRAVENOUS
  Administered 2014-07-24: 10 mg via INTRAVENOUS

## 2014-07-24 MED ORDER — HEPARIN SODIUM (PORCINE) 5000 UNIT/ML IJ SOLN
5000.0000 [IU] | Freq: Once | INTRAMUSCULAR | Status: DC
  Filled 2014-07-24: qty 1

## 2014-07-24 MED ORDER — ACETAMINOPHEN 500 MG PO TABS
1000.0000 mg | ORAL_TABLET | Freq: Four times a day (QID) | ORAL | Status: AC
  Administered 2014-07-24 – 2014-07-25 (×4): 1000 mg via ORAL
  Filled 2014-07-24 (×4): qty 2

## 2014-07-24 MED ORDER — OXYCODONE-ACETAMINOPHEN 5-325 MG PO TABS
1.0000 | ORAL_TABLET | ORAL | Status: DC | PRN
  Administered 2014-07-25 (×2): 1 via ORAL
  Filled 2014-07-24 (×2): qty 1

## 2014-07-24 MED ORDER — PROPOFOL 10 MG/ML IV BOLUS
INTRAVENOUS | Status: DC | PRN
  Administered 2014-07-24: 180 mg via INTRAVENOUS

## 2014-07-24 MED ORDER — LACTATED RINGERS IV SOLN
INTRAVENOUS | Status: DC
  Administered 2014-07-24: 100 mL/h via INTRAVENOUS
  Administered 2014-07-24: 15:00:00 via INTRAVENOUS
  Administered 2014-07-25: 100 mL/h via INTRAVENOUS

## 2014-07-24 MED ORDER — GLYCOPYRROLATE 0.2 MG/ML IJ SOLN
INTRAMUSCULAR | Status: DC | PRN
  Administered 2014-07-24: 0.6 mg via INTRAVENOUS

## 2014-07-24 MED ORDER — BUPIVACAINE-EPINEPHRINE 0.25% -1:200000 IJ SOLN
INTRAMUSCULAR | Status: AC
  Filled 2014-07-24: qty 1

## 2014-07-24 MED ORDER — CHLORHEXIDINE GLUCONATE 0.12 % MT SOLN
15.0000 mL | Freq: Two times a day (BID) | OROMUCOSAL | Status: DC
  Administered 2014-07-24 – 2014-07-25 (×2): 15 mL via OROMUCOSAL
  Filled 2014-07-24 (×8): qty 15

## 2014-07-24 MED ORDER — METOPROLOL TARTRATE 12.5 MG HALF TABLET
12.5000 mg | ORAL_TABLET | Freq: Once | ORAL | Status: AC
  Administered 2014-07-24: 12.5 mg via ORAL
  Filled 2014-07-24 (×2): qty 1

## 2014-07-24 MED ORDER — DEXAMETHASONE SODIUM PHOSPHATE 10 MG/ML IJ SOLN
INTRAMUSCULAR | Status: AC
  Filled 2014-07-24: qty 1

## 2014-07-24 MED ORDER — CEFOTETAN DISODIUM-DEXTROSE 2-2.08 GM-% IV SOLR
INTRAVENOUS | Status: AC
  Filled 2014-07-24: qty 50

## 2014-07-24 MED ORDER — DEXTROSE 5 % IV SOLN
2.0000 g | INTRAVENOUS | Status: AC
  Administered 2014-07-24: 2 g via INTRAVENOUS
  Filled 2014-07-24: qty 2

## 2014-07-24 MED ORDER — MORPHINE SULFATE 2 MG/ML IJ SOLN
2.0000 mg | INTRAMUSCULAR | Status: DC | PRN
  Administered 2014-07-24 (×2): 2 mg via INTRAVENOUS
  Filled 2014-07-24 (×2): qty 1

## 2014-07-24 MED ORDER — ONDANSETRON HCL 4 MG PO TABS: 4.0000 mg | ORAL_TABLET | Freq: Four times a day (QID) | ORAL | Status: DC | PRN

## 2014-07-24 MED ORDER — METOPROLOL TARTRATE 12.5 MG HALF TABLET
12.5000 mg | ORAL_TABLET | Freq: Two times a day (BID) | ORAL | Status: DC
  Administered 2014-07-24 – 2014-07-26 (×5): 12.5 mg via ORAL
  Filled 2014-07-24 (×9): qty 1

## 2014-07-24 MED ORDER — PROPOFOL 10 MG/ML IV BOLUS
INTRAVENOUS | Status: AC
  Filled 2014-07-24: qty 20

## 2014-07-24 MED ORDER — INDOCYANINE GREEN 25 MG IV SOLR
INTRAVENOUS | Status: DC | PRN
  Administered 2014-07-24: 5 mg via INTRAVENOUS

## 2014-07-24 MED ORDER — DIPHENHYDRAMINE HCL 12.5 MG/5ML PO ELIX: 12.5000 mg | ORAL_SOLUTION | Freq: Four times a day (QID) | ORAL | Status: DC | PRN

## 2014-07-24 MED ORDER — HYDROMORPHONE HCL 1 MG/ML IJ SOLN
INTRAMUSCULAR | Status: DC | PRN
  Administered 2014-07-24: 0.5 mg via INTRAVENOUS
  Administered 2014-07-24: 1 mg via INTRAVENOUS
  Administered 2014-07-24: 0.5 mg via INTRAVENOUS

## 2014-07-24 MED ORDER — MIDAZOLAM HCL 2 MG/2ML IJ SOLN
INTRAMUSCULAR | Status: AC
  Filled 2014-07-24: qty 2

## 2014-07-24 SURGICAL SUPPLY — 81 items
BLADE EXTENDED COATED 6.5IN (ELECTRODE) IMPLANT
BLADE SURG SZ11 CARB STEEL (BLADE) ×4 IMPLANT
CANNULA REDUC XI 12-8 STAPL (CANNULA) ×1
CANNULA REDUC XI 12-8MM STAPL (CANNULA) ×1
CANNULA REDUCER 12-8 DVNC XI (CANNULA) ×2 IMPLANT
CELLS DAT CNTRL 66122 CELL SVR (MISCELLANEOUS) IMPLANT
CLIP LIGATING HEM O LOK PURPLE (MISCELLANEOUS) IMPLANT
CLIP LIGATING HEMOLOK MED (MISCELLANEOUS) IMPLANT
COVER TIP SHEARS 8 DVNC (MISCELLANEOUS) ×2 IMPLANT
COVER TIP SHEARS 8MM DA VINCI (MISCELLANEOUS) ×2
DECANTER SPIKE VIAL GLASS SM (MISCELLANEOUS) ×4 IMPLANT
DEVICE TROCAR PUNCTURE CLOSURE (ENDOMECHANICALS) IMPLANT
DRAPE ARM DVNC X/XI (DISPOSABLE) ×8 IMPLANT
DRAPE COLUMN DVNC XI (DISPOSABLE) ×2 IMPLANT
DRAPE DA VINCI XI ARM (DISPOSABLE) ×8
DRAPE DA VINCI XI COLUMN (DISPOSABLE) ×2
DRAPE SURG IRRIG POUCH 19X23 (DRAPES) ×4 IMPLANT
DRSG OPSITE POSTOP 4X10 (GAUZE/BANDAGES/DRESSINGS) IMPLANT
DRSG OPSITE POSTOP 4X6 (GAUZE/BANDAGES/DRESSINGS) ×4 IMPLANT
DRSG OPSITE POSTOP 4X8 (GAUZE/BANDAGES/DRESSINGS) IMPLANT
ELECT PENCIL ROCKER SW 15FT (MISCELLANEOUS) IMPLANT
ELECT REM PT RETURN 15FT ADLT (MISCELLANEOUS) ×4 IMPLANT
ENDOLOOP SUT PDS II  0 18 (SUTURE)
ENDOLOOP SUT PDS II 0 18 (SUTURE) IMPLANT
EVACUATOR SILICONE 100CC (DRAIN) IMPLANT
GAUZE SPONGE 4X4 12PLY STRL (GAUZE/BANDAGES/DRESSINGS) IMPLANT
GLOVE BIO SURGEON STRL SZ 6.5 (GLOVE) ×9 IMPLANT
GLOVE BIO SURGEONS STRL SZ 6.5 (GLOVE) ×3
GLOVE BIOGEL PI IND STRL 7.0 (GLOVE) ×6 IMPLANT
GLOVE BIOGEL PI INDICATOR 7.0 (GLOVE) ×6
GOWN STRL REUS W/TWL 2XL LVL3 (GOWN DISPOSABLE) ×12 IMPLANT
GOWN STRL REUS W/TWL XL LVL3 (GOWN DISPOSABLE) ×16 IMPLANT
HOLDER FOLEY CATH W/STRAP (MISCELLANEOUS) ×4 IMPLANT
KIT PROCEDURE DA VINCI SI (MISCELLANEOUS) ×2
KIT PROCEDURE DVNC SI (MISCELLANEOUS) ×2 IMPLANT
LEGGING LITHOTOMY PAIR STRL (DRAPES) ×4 IMPLANT
NEEDLE INSUFFLATION 14GA 120MM (NEEDLE) ×4 IMPLANT
PACK CARDIOVASCULAR III (CUSTOM PROCEDURE TRAY) ×4 IMPLANT
PACK COLON (CUSTOM PROCEDURE TRAY) ×4 IMPLANT
PACK GENERAL/GYN (CUSTOM PROCEDURE TRAY) ×4 IMPLANT
PORT LAP GEL ALEXIS MED 5-9CM (MISCELLANEOUS) ×4 IMPLANT
RTRCTR WOUND ALEXIS 18CM MED (MISCELLANEOUS)
SCISSORS LAP 5X45 EPIX DISP (ENDOMECHANICALS) ×4 IMPLANT
SEAL CANN UNIV 5-8 DVNC XI (MISCELLANEOUS) ×6 IMPLANT
SEAL XI 5MM-8MM UNIVERSAL (MISCELLANEOUS) ×6
SEALER VESSEL DA VINCI XI (MISCELLANEOUS) ×2
SEALER VESSEL EXT DVNC XI (MISCELLANEOUS) ×2 IMPLANT
SET IRRIG TUBING LAPAROSCOPIC (IRRIGATION / IRRIGATOR) ×4 IMPLANT
SLEEVE XCEL OPT CAN 5 100 (ENDOMECHANICALS) IMPLANT
SOLUTION ELECTROLUBE (MISCELLANEOUS) ×4 IMPLANT
STAPLER 45 BLU RELOAD XI (STAPLE) ×2 IMPLANT
STAPLER 45 BLUE RELOAD XI (STAPLE) ×2
STAPLER 45 GREEN RELOAD XI (STAPLE)
STAPLER 45 GRN RELOAD XI (STAPLE) IMPLANT
STAPLER CANNULA SEAL DVNC XI (STAPLE) ×2 IMPLANT
STAPLER CANNULA SEAL XI (STAPLE) ×2
STAPLER SHEATH (SHEATH) ×2
STAPLER SHEATH ENDOWRIST DVNC (SHEATH) ×2 IMPLANT
STAPLER VISISTAT 35W (STAPLE) ×4 IMPLANT
SUT PDS AB 1 CTX 36 (SUTURE) IMPLANT
SUT PDS AB 1 TP1 96 (SUTURE) IMPLANT
SUT PROLENE 2 0 KS (SUTURE) ×4 IMPLANT
SUT SILK 2 0 (SUTURE) ×4
SUT SILK 2 0 SH CR/8 (SUTURE) ×4 IMPLANT
SUT SILK 2-0 18XBRD TIE 12 (SUTURE) ×2 IMPLANT
SUT SILK 3 0 (SUTURE) ×2
SUT SILK 3 0 SH CR/8 (SUTURE) ×4 IMPLANT
SUT SILK 3-0 18XBRD TIE 12 (SUTURE) ×2 IMPLANT
SUT VIC AB 2-0 SH 18 (SUTURE) IMPLANT
SUT VIC AB 2-0 SH 27 (SUTURE) ×2
SUT VIC AB 2-0 SH 27X BRD (SUTURE) ×2 IMPLANT
SUT VIC AB 4-0 PS2 27 (SUTURE) ×8 IMPLANT
SYRINGE 10CC LL (SYRINGE) ×4 IMPLANT
SYS LAPSCP GELPORT 120MM (MISCELLANEOUS)
SYSTEM LAPSCP GELPORT 120MM (MISCELLANEOUS) IMPLANT
TOWEL OR NON WOVEN STRL DISP B (DISPOSABLE) ×8 IMPLANT
TRAY FOLEY W/METER SILVER 14FR (SET/KITS/TRAYS/PACK) ×4 IMPLANT
TROCAR BLADELESS OPT 5 100 (ENDOMECHANICALS) ×4 IMPLANT
TUBING CONNECTING 10 (TUBING) IMPLANT
TUBING CONNECTING 10' (TUBING)
TUBING FILTER THERMOFLATOR (ELECTROSURGICAL) ×4 IMPLANT

## 2014-07-24 NOTE — Anesthesia Procedure Notes (Signed)
Procedure Name: Intubation Date/Time: 07/24/2014 7:31 AM Performed by: Enriqueta ShutterWILLIFORD, Levester Waldridge D Pre-anesthesia Checklist: Patient identified, Emergency Drugs available, Suction available and Patient being monitored Patient Re-evaluated:Patient Re-evaluated prior to inductionOxygen Delivery Method: Circle System Utilized Preoxygenation: Pre-oxygenation with 100% oxygen Intubation Type: IV induction Ventilation: Mask ventilation without difficulty Laryngoscope Size: Mac and 4 Grade View: Grade II Tube type: Oral Tube size: 7.5 mm Number of attempts: 1 Airway Equipment and Method: Stylet and Oral airway Placement Confirmation: ETT inserted through vocal cords under direct vision,  positive ETCO2 and breath sounds checked- equal and bilateral Secured at: 21 cm Tube secured with: Tape Dental Injury: Teeth and Oropharynx as per pre-operative assessment

## 2014-07-24 NOTE — Transfer of Care (Signed)
Immediate Anesthesia Transfer of Care Note  Patient: Courtney Valenzuela  Procedure(s) Performed: Procedure(s): XI ROBOT ASSISTED LAPAROSCOPIC PARTIAL COLECTOMY (N/A) PROCTOSCOPY  Patient Location: PACU  Anesthesia Type:General  Level of Consciousness: awake, alert  and oriented  Airway & Oxygen Therapy: Patient Spontanous Breathing and Patient connected to face mask oxygen  Post-op Assessment: Report given to RN and Post -op Vital signs reviewed and stable  Post vital signs: Reviewed and stable  Last Vitals:  Filed Vitals:   07/24/14 0526  BP: 129/73  Pulse: 88  Temp: 36.3 C  Resp: 18    Complications: No apparent anesthesia complications

## 2014-07-24 NOTE — Anesthesia Postprocedure Evaluation (Signed)
  Anesthesia Post-op Note  Patient: Ardine EngKaren Brock  Procedure(s) Performed: Procedure(s): XI ROBOT ASSISTED LAPAROSCOPIC PARTIAL COLECTOMY (N/A) PROCTOSCOPY  Patient Location: PACU  Anesthesia Type:General  Level of Consciousness: awake and alert   Airway and Oxygen Therapy: Patient Spontanous Breathing  Post-op Pain: none  Post-op Assessment: Post-op Vital signs reviewed, Patient's Cardiovascular Status Stable and Respiratory Function Stable  Post-op Vital Signs: Reviewed  Filed Vitals:   07/24/14 1230  BP: 138/64  Pulse:   Temp: 36.4 C  Resp:     Complications: No apparent anesthesia complications

## 2014-07-24 NOTE — Anesthesia Preprocedure Evaluation (Addendum)
Anesthesia Evaluation  Patient identified by MRN, date of birth, ID band Patient awake    Reviewed: Allergy & Precautions, NPO status , Patient's Chart, lab work & pertinent test results  Airway Mallampati: II  TM Distance: >3 FB Neck ROM: Full    Dental no notable dental hx.    Pulmonary neg pulmonary ROS,  breath sounds clear to auscultation  Pulmonary exam normal       Cardiovascular hypertension, Pt. on medications Rhythm:Regular Rate:Normal     Neuro/Psych negative neurological ROS  negative psych ROS   GI/Hepatic negative GI ROS, Neg liver ROS,   Endo/Other  negative endocrine ROS  Renal/GU negative Renal ROS  negative genitourinary   Musculoskeletal negative musculoskeletal ROS (+)   Abdominal   Peds negative pediatric ROS (+)  Hematology negative hematology ROS (+)   Anesthesia Other Findings   Reproductive/Obstetrics negative OB ROS                            Anesthesia Physical Anesthesia Plan  ASA: II  Anesthesia Plan: General   Post-op Pain Management:    Induction: Intravenous  Airway Management Planned: Oral ETT  Additional Equipment:   Intra-op Plan:   Post-operative Plan: Extubation in OR  Informed Consent: I have reviewed the patients History and Physical, chart, labs and discussed the procedure including the risks, benefits and alternatives for the proposed anesthesia with the patient or authorized representative who has indicated his/her understanding and acceptance.   Dental advisory given  Plan Discussed with: CRNA  Anesthesia Plan Comments:         Anesthesia Quick Evaluation

## 2014-07-24 NOTE — H&P (Signed)
The patient is a 64 year old female who presents with diverticulitis. The patient states this has been ocurring for 2-3 years with an epsiode every 6 months. Her last episode was in the summer. She has never been hospitalized. She states she had a screening colonoscopy in 2004 which was incomplete due to tumor colon being all of the left side. She had a barium enema that was apparently unremarkable. In 2012 she developed left lower quadrant pain with a fever while she was living in [place]. Reportedly CT scan showed diverticulitis and she was treated with Cipro and Flagyl with resolution. She had a followup colonoscopy which was normal except for the diverticular disease. In 2014 she had another CT proven about of diverticulitis again treated with Cipro Flagyl. At this time she developed recurrent symptoms for several weeks with a CT scan showing continued diverticulitis at the proximal sigmoid colon on the left side. She was switched to Levaquin from Cipro Flagyl and her diverticulitis resolved. Since that time she has had one other episode a few months ago which resolved with Levaquin as well. She sees Dr. Nicholas [place] in [place] for this. Her last colonoscopy was in July of 2014. This showed a polyp in the ascending colon as well as moderate diverticulosis of the whole colon and internal hemorrhoids. 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 I saw her back in October and recommended surgery. She decided against it at that time. She continues to have ongoing left lower quadrant pain in the same area no diverticulitis was seen on CT scan. Antibiotics resolved the pain temporarily but returns shortly after finishing them.  Other Problems [name], MD; 06/20/2014 1:14 PM) Arthritis Asthma Back Pain Cholelithiasis Diverticulosis Hemorrhoids Hypercholesterolemia Lump In Breast Oophorectomy Bilateral. Thyroid Disease DIVERTICULITIS OF SMALL INTESTINE WITHOUT  PERFORATION OR ABSCESS WITHOUT BLEEDING (562.01  K57.12) DIVERTICULITIS OF INTESTINE WITH PERFORATION WITHOUT BLEEDING, UNSPECIFIED PART OF INTESTINAL TRACT (562.11  K57.80) Patient with recurrent episodes of diverticulitis over the past 3 yrs. She has not been hospitalized for this in the past. It has resolved each time with antibiotics.  Past Surgical History [name], MD; 06/20/2014 1:14 PM) Breast Biopsy Bilateral. multiple Colon Polyp Removal - Colonoscopy Hysterectomy (not due to cancer) - Complete Knee Surgery Right. Oral Surgery Shoulder Surgery Left. Thyroid Surgery Tonsillectomy  Diagnostic Studies History [name], MD; 06/20/2014 1:15 PM) Colonoscopy 1-5 years ago Mammogram 1-3 years ago Pap Smear 1-5 years ago  Allergies Michel Bickers, LPN; 0/45/4098 11:91 AM) Erythromycin Derivatives Augmentin *PENICILLINS*  Medication History Michel Bickers, LPN; 4/78/2956 21:30 AM) Synthroid ( Tablet, Oral daily) Active. Flonase Allergy Relief (50MCG/ACT Suspension, Nasal) Active. Simvastatin (  Tablet, Oral daily) Active. Medications Reconciled  Social History [name], MD; 06/20/2014 1:15 PM) Alcohol use Moderate alcohol use. Caffeine use Carbonated beverages, Coffee, Tea. No drug use Tobacco use Never smoker.  Family History [name], MD; 06/20/2014 1:15 PM) Arthritis Father, Mother, Sister. Colon Cancer Family Members In General. Diabetes Mellitus Brother. Heart Disease Father. Hypertension Brother, Father, Mother, Sister. Seizure disorder Sister.   BP 129/73 mmHg  Pulse 88  Temp(Src) 97.3 F (36.3 C) (Oral)  Resp 18  Ht  (1.676 m)  Wt 96.163 kg (212 lb)  BMI 34.23 kg/m2  SpO2 98%   Physical Exam [name] MD; 06/20/2014 1:15 PM)  General Mental Status-Alert. General Appearance-Consistent with stated age. Hydration-Well hydrated. Voice-Normal.  Head and  Neck Head-normocephalic, atraumatic with no lesions or palpable masses. Trachea-midline.  Chest and  Lung Exam Chest and lung exam reveals -quiet, even and easy respiratory effort with no use of accessory muscles and on auscultation, normal breath sounds, no adventitious sounds and normal vocal resonance. Inspection Chest Wall - Normal. Back - normal.  Cardiovascular Cardiovascular examination reveals -normal heart sounds, regular rate and rhythm with no murmurs.  Abdomen Inspection Inspection of the abdomen reveals - No Hernias. Skin - Scar - Note: lower midline surgical scar. Palpation/Percussion Palpation and Percussion of the abdomen reveal - Soft, Non Tender, No Rebound tenderness, No Rigidity (guarding) and No hepatosplenomegaly. Auscultation Auscultation of the abdomen reveals - Bowel sounds normal.  Neurologic Neurologic evaluation reveals -alert and oriented x 3 with no impairment of recent or remote memory. Mental Status-Normal.    Assessment & Plan [name](Tessia Kassin MD; 06/20/2014 1:14 PM)  DIVERTICULITIS OF SMALL INTESTINE WITHOUT PERFORATION OR ABSCESS WITHOUT BLEEDING (562.01  K57.12) Impression: Patient with recurrent episodes of diverticulitis over the past 3 yrs. She has not been hospitalized for this in the past. It has resolved each time with antibiotics except for some smoldering pain in her LLQ that is persistent despite therapy. This corresponds to the area of inflammation seen on CT. I have once again recommended surgical resection. She would like to proceed with this at this time. The surgery and anatomy were described to the patient as well as the risks of surgery and the possible complications. These include: Bleeding, deep abdominal infections and possible wound complications such as hernia and infection, damage to adjacent structures, leak of surgical connections, which can lead to other surgeries and possibly an ostomy, possible need for other  procedures, such as abscess drains in radiology, possible prolonged hospital stay, possible diarrhea from removal of part of the colon, possible constipation from narcotics, possible bowel, bladder or sexual dysfunction if having rectal surgery, prolonged fatigue/weakness or appetite loss, possible early recurrence of of disease, possible complications of their medical problems such as heart disease or arrhythmias or lung problems, death (less than 1%). I believe the patient understands and wishes to proceed with the surgery.  Current Plans

## 2014-07-24 NOTE — Op Note (Signed)
07/24/2014  11:04 AM  PATIENT:  Courtney Valenzuela  64 y.o. female  Patient Care Team: Waldon Merl, PA-C as PCP - General (Physician Assistant) Cherlyn Roberts, MD as Consulting Physician (Dermatology)  PRE-OPERATIVE DIAGNOSIS:  diverticular disease  POST-OPERATIVE DIAGNOSIS:  diverticular disease  PROCEDURE:  XI ROBOT ASSISTED SIGMOID COLECTOMY PROCTOSCOPY   Surgeon(s): [name], MD  ASSISTANT: Leafy Ro, PA ([employer])   ANESTHESIA:   local and general  EBL: Total I/O In: 3000 [I.V.:3000] Out: 250 [Urine:250]  Delay start of Pharmacological VTE agent (>24hrs) due to surgical blood loss or risk of bleeding:  no  DRAINS: none   SPECIMEN:  Source of Specimen:  rectosigmoid  DISPOSITION OF SPECIMEN:  PATHOLOGY  COUNTS:  YES  PLAN OF CARE: Admit to inpatient   PATIENT DISPOSITION:  PACU - hemodynamically stable.  INDICATION:    64 y.o. F with recurrent diverticulitis and chronic LLQ pain.  I recommended segmental resection:  The anatomy & physiology of the digestive tract was discussed.  The pathophysiology was discussed.  Natural history risks without surgery was discussed.   I worked to give an overview of the disease and the frequent need to have multispecialty involvement.  I feel the risks of no intervention will lead to serious problems that outweigh the operative risks; therefore, I recommended a partial colectomy to remove the pathology.  Laparoscopic & open techniques were discussed.   Risks such as bleeding, infection, abscess, leak, reoperation, possible ostomy, hernia, heart attack, death, and other risks were discussed.  I noted a good likelihood this will help address the problem.   Goals of post-operative recovery were discussed as well.    The patient expressed understanding & wished to proceed with surgery.  OR FINDINGS:   Patient had significant adhesions in her pelvis from prior hysterectomy and diverticular disease.  The  anastomosis rests ~15 cm from the anal verge by rigid proctoscopy.  DESCRIPTION:   Informed consent was confirmed.  The patient underwent general anaesthesia without difficulty.  The patient was positioned appropriately.  VTE prevention in place.  The patient's abdomen was clipped, prepped, & draped in a sterile fashion.  Surgical timeout confirmed our plan.  The patient was positioned in reverse Trendelenburg.  Abdominal entry was gained using a Varies.  Entry was clean.  I induced carbon dioxide insufflation.  An 8mm port was placed in the LUQ. Camera inspection revealed no injury.   Extra ports were carefully placed under direct laparoscopic visualization.  A 12mm port was placed periumbilically.    I reflected the greater omentum and the upper abdomen the small bowel in the upper abdomen, but this was adherent to the sigmoid.  The paitent was placed in trendelenburg and right side down.  The robot was docked to the patient's left side.  Instruments were placed under direct visualization.  The patient had significant pelvic adhesions.  I spent ~30 minutes taking down small bowel and right colon adhesions with scissors.  Once these were mobilized out of the pelvis, I was able to mobilize the sigmoid from the pelvic wall.  I identified the ureter laterally and stayed medial to this.  I scored the base of peritoneum of the right side of the mesentery of the left colon to the peritoneal reflection of the mid rectum.  I elevated the sigmoid mesentery and enetered into the retro-mesenteric plane. We were able to identify the left ureter and gonadal vessels. We kept those posterior within the retroperitoneum and elevated the left  colon mesentery off that. I did isolated IMA pedicle but did not ligate it yet.  I continued distally and got into the avascular plane posterior to the mesorectum. This allowed me to help mobilize the rectum as well by freeing the mesorectum off the sacrum.  I mobilized the peritoneal  coverings towards the peritoneal reflection on both the right and left sides of the rectum.  I could see the right and left ureters and stayed away from them.    I skeletonized the inferior mesenteric artery pedicle.  After confirming the left ureter was out of the way, I went ahead and ligated the inferior mesenteric artery pedicle with bipolar vessel sealer.  I did ligate the sigmoid vein in a similar fashion.  We ensured hemostasis. I skeletonized the mesorectum at the junction at the proximal rectum using blunt dissection & bipolar vessel sealer.  I mobilized the left colon in a lateral to medial fashion off the line of Toldt up towards the splenic flexure to ensure good mobilization of the left colon to reach into the pelvis. I dissected the sigmoid colon out of the pelvis down to the level of the rectum.  I identified an area where the tenia seemed to splay below the level of the sacral prominence.  I skeletonized the mesorectum with the vessel sealer.  I then used a blue load stapler to divide the rectum.  I used a 2-0 Vicryl suture to imbricate the corner of the rectal stump.  I then used 2.275ml of firefly to assess perfusion.  I identified an area of perfused sigmoid proximal to the area of inflammation.  I skeletonized the mesentery to this point.  We then removed the robot and made a lower midline incision at the previous scar.  The fascia was divided with cautery.  The peritoneum was entered bluntly.  An Alexis wound protector was placed.  The colon was removed.  Our proximal site was identified.  I actually went back a little farther due to some disease noted in the mesentery.  A purse string device was placed and a 2-0 Prolene was used the create the purse string.  This was secured with 3-0 silk sutures.  The purse string was then tied around a 29 mm EEA anvil.  This was placed back into the abdomen and the abdomen was re-insufflated.  The anastomosis was created without difficulty or tension.  It  was tested under water and no leak was noted with insufflation.  The anastomosis rests ~15cm from the anal verge.  I then closed my 12 mm port with a 0 vicryl and laparoscopic suture passer.  The pelvis was irrigated.  Hemostasis was good.  We switched to clean drapes, instruments, gowns and gloves.     The fascia was closed with interrupted #1 Novofil sutures.  The subcutaneous tissue was closed with interrupted 2-0 Vicryl sutures.  The skin of all incisions was closed with 4-0 Vicryl suture.   Derma bond was placed on port sites and a sterile dressing was placed on the extraction site.  All counts were correct per OR staff.  The patient was sent to the PACU in stable condition.

## 2014-07-25 ENCOUNTER — Encounter (HOSPITAL_COMMUNITY): Payer: Self-pay | Admitting: General Surgery

## 2014-07-25 LAB — BASIC METABOLIC PANEL
Anion gap: 7 (ref 5–15)
BUN: 11 mg/dL (ref 6–23)
CALCIUM: 8.3 mg/dL — AB (ref 8.4–10.5)
CO2: 24 mmol/L (ref 19–32)
Chloride: 108 mmol/L (ref 96–112)
Creatinine, Ser: 0.97 mg/dL (ref 0.50–1.10)
GFR, EST AFRICAN AMERICAN: 71 mL/min — AB (ref >90)
GFR, EST NON AFRICAN AMERICAN: 61 mL/min — AB (ref >90)
Glucose, Bld: 128 mg/dL — ABNORMAL HIGH (ref 70–99)
Potassium: 3.9 mmol/L (ref 3.5–5.1)
Sodium: 139 mmol/L (ref 135–145)

## 2014-07-25 LAB — CBC
HCT: 36.8 % (ref 36.0–46.0)
Hemoglobin: 12.2 g/dL (ref 12.0–15.0)
MCH: 28.8 pg (ref 26.0–34.0)
MCHC: 33.2 g/dL (ref 30.0–36.0)
MCV: 87 fL (ref 78.0–100.0)
Platelets: 195 10*3/uL (ref 150–400)
RBC: 4.23 MIL/uL (ref 3.87–5.11)
RDW: 13.6 % (ref 11.5–15.5)
WBC: 9.1 10*3/uL (ref 4.0–10.5)

## 2014-07-25 NOTE — Care Management Note (Signed)
    Page 1 of 1   07/25/2014     11:27:47 AM CARE MANAGEMENT NOTE 07/25/2014  Patient:  Courtney Valenzuela,Courtney Valenzuela   Account Number:  [id number]  Date Initiated:  07/25/2014  Documentation initiated by:  Lorenda IshiharaPEELE,Aiko Belko  Subjective/Objective Assessment:   64 yo female admitted s/p sigmoid colectomy. PTA lived at home with spouse.     Action/Plan:   Discharge planning   Anticipated DC Date:  07/27/2014   Anticipated DC Plan:  HOME/SELF CARE      DC Planning Services  CM consult      Choice offered to / List presented to:             Status of service:  Completed, signed off Medicare Important Message given?   (If response is "NO", the following Medicare IM given date fields will be blank) Date Medicare IM given:   Medicare IM given by:   Date Additional Medicare IM given:   Additional Medicare IM given by:    Discharge Disposition:  HOME/SELF CARE  Per UR Regulation:  Reviewed for med. necessity/level of care/duration of stay  If discussed at Long Length of Stay Meetings, dates discussed:    Comments:

## 2014-07-25 NOTE — Progress Notes (Signed)
1 Day Post-Op robotic assisted sigmoidectomy Subjective: Doing well.  No nausea.  Ambulating.  Good UOP  Objective: Vital signs in last 24 hours: Temp:  [97.5 F (36.4 C)-98.4 F (36.9 C)] 97.8 F (36.6 C) (04/26 0456) Pulse Rate:  [53-91] 58 (04/26 0456) Resp:  [16-23] 18 (04/26 0456) BP: (107-147)/(56-67) 107/59 mmHg (04/26 0456) SpO2:  [92 %-100 %] 95 % (04/26 0456)   Intake/Output from previous day: 04/25 0701 - 04/26 0700 In: 5121.7 [I.V.:5121.7] Out: 1650 [Urine:1650] Intake/Output this shift:     General appearance: alert and cooperative GI: normal findings: soft, appropriately tender  Incision: no significant drainage  Lab Results:   Recent Labs  07/25/14 0449  WBC 9.1  HGB 12.2  HCT 36.8  PLT 195   BMET  Recent Labs  07/25/14 0449  NA 139  K 3.9  CL 108  CO2 24  GLUCOSE 128*  BUN 11  CREATININE 0.97  CALCIUM 8.3*   PT/INR No results for input(s): LABPROT, INR in the last 72 hours. ABG No results for input(s): PHART, HCO3 in the last 72 hours.  Invalid input(s): PCO2, PO2  MEDS, Scheduled . alvimopan  12 mg Oral BID  . antiseptic oral rinse  7 mL Mouth Rinse q12n4p  . chlorhexidine  15 mL Mouth Rinse BID  . enoxaparin (LOVENOX) injection  40 mg Subcutaneous Q24H  . levothyroxine  75 mcg Oral QAC breakfast  . metoprolol tartrate  12.5 mg Oral BID  . simvastatin  20 mg Oral QHS    Studies/Results: No results found.  Assessment: s/p Procedure(s): XI ROBOT ASSISTED LAPAROSCOPIC PARTIAL COLECTOMY PROCTOSCOPY Patient Active Problem List   Diagnosis Date Noted  . 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 Screening for osteoporosis 03/06/2014  . Screening for ischemic heart disease 03/06/2014  . Acute bacterial bronchitis 03/06/2014  . Diverticulitis of large intestine without perforation or abscess without bleeding 01/06/2014  . TMJ tenderness 08/19/2013  . Hypothyroidism 06/16/2013  . Seasonal  allergies 06/16/2013  . Left knee injury 06/16/2013  . Visit for preventive health examination 02/20/2013  . Hyperlipidemia 02/20/2013  . Palpitations 12/27/2012    Expected post op course  Plan: Advance diet to sips of clears as tolerated Ambulate Minimize IVFs   LOS: 1 day     .Vanita PandaAlicia C Andrewjames Weirauch, MD [facility], [place] [phone]   07/25/2014 7:51 AM

## 2014-07-25 NOTE — Progress Notes (Signed)
PT Cancellation Note  Patient Details Name: Courtney Valenzuela MRN: [id number] DOB: 1951-03-06   Cancelled Treatment:    Reason Eval/Treat Not Completed: PT screened, no needs identified, will sign off; pt reports she has been amb in the hallway multiple times without difficulty;   [facility] CenterWILLIAMS,Sesilia Poucher 07/25/2014, 11:37 AM

## 2014-07-26 LAB — CBC
HCT: 37.7 % (ref 36.0–46.0)
Hemoglobin: 12.2 g/dL (ref 12.0–15.0)
MCH: 28.3 pg (ref 26.0–34.0)
MCHC: 32.4 g/dL (ref 30.0–36.0)
MCV: 87.5 fL (ref 78.0–100.0)
PLATELETS: 171 10*3/uL (ref 150–400)
RBC: 4.31 MIL/uL (ref 3.87–5.11)
RDW: 13.6 % (ref 11.5–15.5)
WBC: 9.6 10*3/uL (ref 4.0–10.5)

## 2014-07-26 LAB — BASIC METABOLIC PANEL
Anion gap: 7 (ref 5–15)
BUN: 11 mg/dL (ref 6–23)
CO2: 24 mmol/L (ref 19–32)
Calcium: 8.2 mg/dL — ABNORMAL LOW (ref 8.4–10.5)
Chloride: 107 mmol/L (ref 96–112)
Creatinine, Ser: 0.82 mg/dL (ref 0.50–1.10)
GFR calc Af Amer: 87 mL/min — ABNORMAL LOW (ref >90)
GFR calc non Af Amer: 75 mL/min — ABNORMAL LOW (ref >90)
GLUCOSE: 106 mg/dL — AB (ref 70–99)
Potassium: 3.7 mmol/L (ref 3.5–5.1)
Sodium: 138 mmol/L (ref 135–145)

## 2014-07-26 NOTE — Progress Notes (Signed)
2 Days Post-Op robotic assisted sigmoidectomy Subjective: Doing well.  No nausea.  Ambulating.  Good UOP.  Passing flatus.    Objective: Vital signs in last 24 hours: Temp:  [97.5 F (36.4 C)-99.5 F (37.5 C)] 98.6 F (37 C) (04/27 0618) Pulse Rate:  [66-94] 68 (04/27 0618) Resp:  [18] 18 (04/27 0618) BP: (124-141)/(42-60) 128/51 mmHg (04/27 0618) SpO2:  [95 %-98 %] 96 % (04/27 0618)   Intake/Output from previous day: 04/26 0701 - 04/27 0700 In: 2849.2 [P.O.:1320; I.V.:1529.2] Out: 3000 [Urine:3000] Intake/Output this shift:   General appearance: alert and cooperative GI: normal findings: soft, appropriately tender  Incision: no significant drainage  Lab Results:   Recent Labs  07/25/14 0449 07/26/14 0510  WBC 9.1 9.6  HGB 12.2 12.2  HCT 36.8 37.7  PLT 195 171   BMET  Recent Labs  07/25/14 0449 07/26/14 0510  NA 139 138  K 3.9 3.7  CL 108 107  CO2 24 24  GLUCOSE 128* 106*  BUN 11 11  CREATININE 0.97 0.82  CALCIUM 8.3* 8.2*   PT/INR No results for input(s): LABPROT, INR in the last 72 hours. ABG No results for input(s): PHART, HCO3 in the last 72 hours.  Invalid input(s): PCO2, PO2  MEDS, Scheduled . alvimopan  12 mg Oral BID  . antiseptic oral rinse  7 mL Mouth Rinse q12n4p  . chlorhexidine  15 mL Mouth Rinse BID  . enoxaparin (LOVENOX) injection  40 mg Subcutaneous Q24H  . levothyroxine  75 mcg Oral QAC breakfast  . metoprolol tartrate  12.5 mg Oral BID  . simvastatin  20 mg Oral QHS    Studies/Results: No results found.  Assessment: s/p Procedure(s): XI ROBOT ASSISTED LAPAROSCOPIC PARTIAL COLECTOMY PROCTOSCOPY Patient Active Problem List   Diagnosis Date Noted  . 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 Screening for osteoporosis 03/06/2014  . Screening for ischemic heart disease 03/06/2014  . Acute bacterial bronchitis 03/06/2014  . Diverticulitis of large intestine without perforation or  abscess without bleeding 01/06/2014  . TMJ tenderness 08/19/2013  . Hypothyroidism 06/16/2013  . Seasonal allergies 06/16/2013  . Left knee injury 06/16/2013  . Visit for preventive health examination 02/20/2013  . Hyperlipidemia 02/20/2013  . Palpitations 12/27/2012    Expected post op course  Plan: Advance diet to soft foods Ambulate SL IVFs PO pain meds Poss d/c tom   LOS: 2 days     .Vanita PandaAlicia C Levie Owensby, MD [facility], [place] [phone]   07/26/2014 8:47 AM

## 2014-07-27 ENCOUNTER — Ambulatory Visit: Payer: Self-pay | Admitting: Cardiology

## 2014-07-27 MED ORDER — OXYCODONE-ACETAMINOPHEN 5-325 MG PO TABS: 1.0000 | ORAL_TABLET | ORAL | Status: DC | PRN

## 2014-07-27 NOTE — Discharge Summary (Signed)
Physician Discharge Summary  Patient ID: Ardine EngKaren Woulfe MRN: [id number] DOB/AGE: [birth year] 63 y.o.  Admit date: 07/24/2014 Discharge date: 07/27/2014  Admission Diagnoses: Diverticular disease  Discharge Diagnoses:  Active Problems:   Diverticular disease   Discharged Condition: good  Hospital Course: Patient admitted after surgery.  No acute issues.  Diet advanced as tolerated.  Foley removed on POD 2.  Patient was tolerating a diet, PO pain medications, ambulating without difficulty and having bowel and bladder function at d/c.    Consults: None  Significant Diagnostic Studies: labs: cbc, chemistry  Treatments: IV hydration, analgesia: acetaminophen w/ codeine and surgery: see above  Discharge Exam: Blood pressure 156/63, pulse 62, temperature 98.4 F (36.9 C), temperature source Oral, resp. rate 18, height 5\' 6"  (1.676 m), weight 96.163 kg (212 lb), SpO2 96 %. General appearance: alert and cooperative GI: normal findings: soft, non-tender Incision/Wound: clean, dry, intact  Disposition: 01-Home or Self Care     Medication List    STOP taking these medications        oseltamivir 75 MG capsule  Commonly known as:  TAMIFLU      TAKE these medications        Calcium + D3 600-200 MG-UNIT Tabs  Take 1 tablet by mouth 2 (two) times daily.     FISH OIL PO  Take 1 tablet by mouth every morning.     fluticasone 50 MCG/ACT nasal spray  Commonly known as:  FLONASE  Place 1 spray into both nostrils daily.     ibuprofen 200 MG tablet  Commonly known as:  ADVIL,MOTRIN  Take 400 mg by mouth every 6 (six) hours as needed for headache or moderate pain.     ketotifen 0.025 % ophthalmic solution  Commonly known as:  ZADITOR  Place 1 drop into both eyes daily as needed (itching.).     levothyroxine 75 MCG tablet  Commonly known as:  SYNTHROID  Take 1 tablet (75 mcg total) by mouth daily before breakfast.     Loratadine 10 MG Caps  Commonly known as:  CLARITIN  Take 1  capsule (10 mg total) by mouth daily.     metoprolol tartrate 25 MG tablet  Commonly known as:  LOPRESSOR  Take 1/2 tablet twice a day     metoprolol tartrate 25 MG tablet  Commonly known as:  LOPRESSOR  TAKE ONE-HALF (1/2) TABLET TWICE A DAY     oxyCODONE-acetaminophen 5-325 MG per tablet  Commonly known as:  PERCOCET/ROXICET  Take 1 tablet by mouth every 4 (four) hours as needed for moderate pain.     simvastatin 20 MG tablet  Commonly known as:  ZOCOR  TAKE 1 TABLET AT BEDTIME           Follow-up Information    Schedule an appointment as soon as possible for a visit with Vanita PandaHOMAS, Shalia Bartko C., MD.   Specialty:  General Surgery   Contact information:   [address] [place] [address] [phone]       Signed: Vanita PandaHOMAS, Ambri Miltner C. 07/27/2014, 8:17 AM

## 2014-07-27 NOTE — Progress Notes (Signed)
Discharge instructions reviewed with pt and husband who was at bedside. Prescriptions given and explained. IV discontinued. All questions answered by pt and husband. Pt to be taken downstairs to car with husband.

## 2014-07-27 NOTE — Discharge Instructions (Signed)

## 2014-09-08 ENCOUNTER — Ambulatory Visit (INDEPENDENT_AMBULATORY_CARE_PROVIDER_SITE_OTHER): Payer: 59 | Admitting: Cardiology

## 2014-09-08 ENCOUNTER — Encounter: Payer: Self-pay | Admitting: Cardiology

## 2014-09-08 VITALS — BP 124/60 | HR 62 | Ht 66.0 in | Wt 206.0 lb

## 2014-09-08 DIAGNOSIS — I10 Essential (primary) hypertension: Secondary | ICD-10-CM

## 2014-09-08 DIAGNOSIS — I5032 Chronic diastolic (congestive) heart failure: Secondary | ICD-10-CM | POA: Diagnosis not present

## 2014-09-08 DIAGNOSIS — R002 Palpitations: Secondary | ICD-10-CM | POA: Diagnosis not present

## 2014-09-08 DIAGNOSIS — E785 Hyperlipidemia, unspecified: Secondary | ICD-10-CM | POA: Diagnosis not present

## 2014-09-08 LAB — HEPATIC FUNCTION PANEL
ALT: 18 U/L (ref 0–35)
AST: 19 U/L (ref 0–37)
Albumin: 4.1 g/dL (ref 3.5–5.2)
Alkaline Phosphatase: 69 U/L (ref 39–117)
Bilirubin, Direct: 0.2 mg/dL (ref 0.0–0.3)
Total Bilirubin: 1.4 mg/dL — ABNORMAL HIGH (ref 0.2–1.2)
Total Protein: 6.8 g/dL (ref 6.0–8.3)

## 2014-09-08 NOTE — Patient Instructions (Signed)
Medication Instructions:  None  Labwork: NMR and LFT Today  Testing/Procedures: None  Follow-Up: Your physician wants you to follow-up in: 1 year with Dr. Delton See.  You will receive a reminder letter in the mail two months in advance. If you don't receive a letter, please call our office to schedule the follow-up appointment.   Any Other Special Instructions Will Be Listed Below (If Applicable).

## 2014-09-08 NOTE — Progress Notes (Signed)
Patient ID: Courtney Valenzuela, female   DOB: 05/29/1950, 64 y.o.   MRN: [id number]    Patient Name: Courtney Valenzuela Date of Encounter: 09/08/2014  Primary Care Provider:  Piedad Climes, PA-C Primary Cardiologist:  Tobias Alexander H  Patient Profile  Palpitations  Problem List   Past Medical History  Diagnosis Date  . Thyroid disease   . Hyperlipidemia   . Anxiety   . Palpitations   . Chicken pox   . Diverticulitis   . Cubital tunnel syndrome   . Seasonal allergies   . Malrotation of colon 2014    Appendix/entire colon on Left  . Hypertension   . Asthma   . Arthritis   . Shoulder pain, left    Past Surgical History  Procedure Laterality Date  . Tonsillectomy    . Abdominal hysterectomy    . Shoulder surgery    . Thyroidectomy, partial    . Laproscopy      Abdominal Scar TRissue  . Wrist surgery    . Knee surgery    . Breast biopsy    . Wisdom tooth extraction    . Proctoscopy  07/24/2014    Procedure: PROCTOSCOPY;  Surgeon: [name], MD;  Location: [facility];  Service: General;;   Allergies  Allergies  Allergen Reactions  . Augmentin [Amoxicillin-Pot Clavulanate]     "gastric issues"  . Erythromycin     "Gastric issues"   HPI  64 year old female with h/o hypothyroidism, hyperlipidemia who went to the ER for sudden onset palpitations while at work. They lasted for about 15 minutes before she went to the ER. She was found to be in sinus tachycardia with possible left atrial enlargement on the ECG. She was ruled out for ACS. She was so anxious at the time that she doesn't remember if she had any associated SOB or chest pain. The tachycardia slowly resolved. The patient is otherwise physically active, she describes a walk of 40 blocks the last weekend without any chest pain or SOB. The patient was followed in [facility] by a cardiologist as a part of a study with hereditary hyperlipidemia. Her father has severe hypercholesterolemia and CAD with 3 stents  placements in the past.  She had a negative stress test in 2011, negative cath in 2004 and was treated for hypercholesterolemia by Simvastatin/niacin combination. She doesn't tolerate hot flushes associated with Niacin very well.  Holter monitor was completely normal. She was started on low dose metoprolol. Today she reports feeling well and resolution of all of her symptoms.   09/08/2014 - the patient is coming after 1 year, she underwent partial colectomy without any complications. Se has started to do limited exercise and denies any chest pain, SOB, palpitations, LE edema, orthopnea, dizziness or syncope, she is complaint with her meds. No more palpitations since stared metoprolol.  She hasn't had her lipids checked in a while, no side effects with simvastatin.   Home Medications  Current outpatient prescriptions:  .  Calcium Carb-Cholecalciferol (CALCIUM + D3) 600-200 MG-UNIT TABS, Take 1 tablet by mouth 2 (two) times daily., Disp: 60 tablet, Rfl: 1 .  fluticasone (FLONASE) 50 MCG/ACT nasal spray, Place 1 spray into both nostrils daily. (Patient taking differently: Place 1 spray into both nostrils daily as needed for allergies or rhinitis. ), Disp: 16 g, Rfl: 0 .  ibuprofen (ADVIL,MOTRIN) 200 MG tablet, Take 400 mg by mouth every 6 (six) hours as needed for headache or moderate pain., Disp: , Rfl:  .  ketotifen (ZADITOR) 0.025 % ophthalmic solution, Place 1 drop into both eyes daily as needed (itching.)., Disp: , Rfl:  .  levothyroxine (SYNTHROID) 75 MCG tablet, Take 1 tablet (75 mcg total) by mouth daily before breakfast., Disp: 90 tablet, Rfl: 2 .  Loratadine (CLARITIN) 10 MG CAPS, Take 1 capsule (10 mg total) by mouth daily. (Patient taking differently: Take 10 mg by mouth daily as needed (allergies. (am)). ), Disp: 30 each, Rfl: 2 .  metoprolol tartrate (LOPRESSOR) 25 MG tablet, TAKE ONE-HALF (1/2) TABLET TWICE A DAY, Disp: 90 tablet, Rfl: 0 .  Omega-3 Fatty Acids (FISH OIL PO), Take 1  tablet by mouth every morning., Disp: , Rfl:  .  oxyCODONE-acetaminophen (PERCOCET/ROXICET) 5-325 MG per tablet, Take 1 tablet by mouth every 4 (four) hours as needed for moderate pain., Disp: 20 tablet, Rfl: 0 .  simvastatin (ZOCOR) 20 MG tablet, TAKE 1 TABLET AT BEDTIME, Disp: 90 tablet, Rfl: 0   Family History  Family History  Problem Relation Age of Onset  . Heart Problems Father     cardiac stents  . Hyperlipidemia Father   . Heart Problems Mother     Palpitations  . Transient ischemic attack Mother   . Diabetes Brother   . Arthritis-Osteo Father   . Arthritis-Osteo Mother   . Arthritis Sister   . Colon cancer Maternal Grandmother   . Heart disease Paternal Grandmother   . Heart disease Paternal Grandfather   . Memory loss Mother   . Gallbladder disease Brother   . Hypertension Other    Social History  Review of Systems General:  No chills, fever, night sweats or weight changes.  Cardiovascular:  No chest pain, dyspnea on exertion, edema, orthopnea, + palpitations, paroxysmal nocturnal dyspnea. Dermatological: No rash, lesions/masses Respiratory: No cough, dyspnea Urologic: No hematuria, dysuria Abdominal:   No nausea, vomiting, diarrhea, bright red blood per rectum, melena, or hematemesis Neurologic:  No visual changes, wkns, changes in mental status. All other systems reviewed and are otherwise negative except as noted above.  Physical Exam  Blood pressure 124/60, pulse 62, height 5\' 6"  (1.676 m), weight 206 lb (93.441 kg).  General: Pleasant, NAD Psych: Normal affect. Neuro: Alert and oriented X 3. Moves all extremities spontaneously. HEENT: Normal  Neck: Supple without bruits or JVD. Lungs:  Resp regular and unlabored, CTA. Heart: RRR no s3, s4, or murmurs. Abdomen: Soft, non-tender, non-distended, BS + x 4.  Extremities: No clubbing, cyanosis or edema. DP/PT/Radials 2+ and equal bilaterally.  Accessory Clinical Findings  ECG - SR, 76 BPM, normal  ECG  Echo: 01/12/2013 ------------------------------------------------------------ Study Conclusions  - Left ventricle: The cavity size was normal. Wall thickness was normal. Systolic function was normal. The estimated ejection fraction was in the range of 60% to 65%. Wall motion was normal; there were no regional wall motion abnormalities. Features are consistent with a pseudonormal left ventricular filling pattern, with concomitant abnormal relaxation and increased filling pressure (grade 2 diastolic dysfunction). - Aortic valve: There was no stenosis. - Mitral valve: No significant regurgitation. - Left atrium: The atrium was moderately dilated. - Right ventricle: The cavity size was normal. Systolic function was normal. - Pulmonary arteries: No complete TR doppler jet so unable to estimate PA systolic pressure. - Inferior vena cava: The vessel was normal in size; the respirophasic diameter changes were in the normal range (= 50%); findings are consistent with normal central venous pressure. Impressions:  - Normal LV size and systolic function, EF 60-65%. Normal RV size  and systolic function. No significant valvular abnormalities.   Assessment & Plan  64 year old female   1. Palpitations - there is only 1  - 3 beat SVT during 48Hour Holter, echo shows normal LV EF but pseudonormal pattern, moderately enlarged LA. After initiation of Metoprolol 12.5 mg PO BID resolution of her symptoms.  2. Chronic diastolic CHF - euvolemic  3. Hyperlipidemia - well controlled, side effects of niacin, we wil d/c and continue Simvastatin only. The patient is encouraged to exercise. Normal AST, ALT, rising Bili  1.1-->1.4-->1.7, we will check again today. Repeat NMR lipids.  4. Hypertension - controlled.  Follow up in 1 year, LFTs and NMR lipids today.   Lars Masson, MD 09/08/2014, 10:09 AM

## 2014-09-10 ENCOUNTER — Other Ambulatory Visit: Payer: Self-pay | Admitting: Physician Assistant

## 2014-09-11 ENCOUNTER — Telehealth: Payer: Self-pay | Admitting: *Deleted

## 2014-09-11 LAB — NMR LIPOPROFILE WITH LIPIDS
Cholesterol, Total: 183 mg/dL (ref 100–199)
HDL Particle Number: 29.5 umol/L — ABNORMAL LOW (ref >=30.5)
HDL Size: 9 nm — ABNORMAL LOW (ref >=9.2)
HDL-C: 42 mg/dL (ref >39)
LDL (calc): 107 mg/dL — ABNORMAL HIGH (ref 0–99)
LDL Particle Number: 1604 nmol/L — ABNORMAL HIGH (ref <1000)
LDL Size: 20.8 nm (ref >=20.8)
LP-IR Score: 62 — ABNORMAL HIGH (ref <=45)
Large HDL-P: 5 umol/L (ref >=4.8)
Large VLDL-P: 6.8 nmol/L — ABNORMAL HIGH (ref <=2.7)
Small LDL Particle Number: 790 nmol/L — ABNORMAL HIGH (ref <=527)
Triglycerides: 172 mg/dL — ABNORMAL HIGH (ref 0–149)
VLDL Size: 50.6 nm — ABNORMAL HIGH (ref <=46.6)

## 2014-09-11 NOTE — Telephone Encounter (Signed)
-----   Message from Lars Masson, MD sent at 09/11/2014  1:27 PM EDT ----- Could you please refer her to the lipid clinic? Thank you, KN

## 2014-09-11 NOTE — Telephone Encounter (Signed)
Contacted the pt to inform her that per Dr Delton See she had normal LFTs, total bilirubin has improved, but she would like the pt referred to lipid clinic for abnormal NMR lipo with Lipids. Informed the pt that I will send our schedulers a message to contact the pt to have her lipid clinic appt scheduled with Audrie Lia PharmD for abnormal NMR.  Pt verbalized understanding and agrees with this plan.

## 2014-09-14 ENCOUNTER — Ambulatory Visit (INDEPENDENT_AMBULATORY_CARE_PROVIDER_SITE_OTHER): Payer: 59 | Admitting: Pharmacist

## 2014-09-14 DIAGNOSIS — E785 Hyperlipidemia, unspecified: Secondary | ICD-10-CM

## 2014-09-14 NOTE — Patient Instructions (Signed)
Start Zetia 10mg  once daily.  If you have any problems, please call Kennon Rounds at [phone]  Continue all of your other medications.   Recheck labs in 3 months.

## 2014-09-15 NOTE — Progress Notes (Signed)
Patient ID: Courtney Valenzuela, female   DOB: February 08, 1951, 64 y.o.   MRN: [id number]    HPI  Courtney Valenzuela is a 64 year old female patient of Dr. Delton See with h/o hypothyroidism, hyperlipidemia and palpitations.  She does not have any history of ASCVD herself but has a family history of hyperlipidemia and heart disease.  The patient was followed in [facility] by a cardiologist as a part of a study with hereditary hyperlipidemia but unfortunately she does not know any of the results of this blood work.  She has previously been treated with Simcor but had issues with flushing so she was changed to simvastatin  only.  She has no issues tolerating the simvastatin.   Pt had a colon resection last year so this has affected her diet.  She is slowly adding fiber back in and still avoiding high fat foods due to stomach upset.  Breakfast may consist of shredded wheat and Fiber 1 bar with skim milk.  Lunch may consist of a [employer] sandwich, [employer], yogurt or occasional [place] food.  For dinner she likes to use Weight Watcher's recipes.  She drinks diet Coke, and decaf unsweet tea.  For exercise, she recently bought a FitBit and has been consistently getting ~7000 steps per day.  She likes to ride her bike but has not done so since her surgery last week.   RF: age, FH Goals: LDL <130 mg/dL (<914 mg/dL optimal) Current Therapy: simvastatin  daily Intolerances: Simcor (flushing)  Labs:  08/2014: TC 183, TG 172, HDL 42, LDL 107, LDL-p 1604, LFTs normal (simvastatin  daily)  Home Medications Current Outpatient Prescriptions  Medication Sig Dispense Refill  . Calcium Carb-Cholecalciferol (CALCIUM + D3) 600-200 MG-UNIT TABS Take 1 tablet by mouth 2 (two) times daily. 60 tablet 1  . fluticasone (FLONASE) 50 MCG/ACT nasal spray Place 1 spray into both nostrils daily. (Patient taking differently: Place 1 spray into both nostrils daily as needed for allergies or rhinitis. ) 16 g 0  . ibuprofen  (ADVIL,MOTRIN) 200 MG tablet Take 400 mg by mouth every 6 (six) hours as needed for headache or moderate pain.    Marland Kitchen ketotifen (ZADITOR) 0.025 % ophthalmic solution Place 1 drop into both eyes daily as needed (itching.).    Marland Kitchen levothyroxine (SYNTHROID, LEVOTHROID) 75 MCG tablet TAKE 1 TABLET DAILY BEFORE BREAKFAST 90 tablet 1  . Loratadine (CLARITIN) 10 MG CAPS Take 1 capsule (10 mg total) by mouth daily. (Patient taking differently: Take 10 mg by mouth daily as needed (allergies. (am)). ) 30 each 2  . metoprolol tartrate (LOPRESSOR) 25 MG tablet TAKE ONE-HALF (1/2) TABLET TWICE A DAY 90 tablet 0  . Omega-3 Fatty Acids (FISH OIL PO) Take 1 tablet by mouth every morning.    Marland Kitchen oxyCODONE-acetaminophen (PERCOCET/ROXICET) 5-325 MG per tablet Take 1 tablet by mouth every 4 (four) hours as needed for moderate pain. 20 tablet 0  . simvastatin (ZOCOR) 20 MG tablet TAKE 1 TABLET AT BEDTIME 90 tablet 0   No current facility-administered medications for this visit.    Family History  Family History  Problem Relation Age of Onset  . Heart Problems Father     cardiac stents  . Hyperlipidemia Father   . Heart Problems Mother     Palpitations  . Transient ischemic attack Mother   . Diabetes Brother   . Arthritis-Osteo Father   . Arthritis-Osteo Mother   . Arthritis Sister   . Colon cancer Maternal Grandmother   .  Heart disease Paternal Grandmother   . Heart disease Paternal Grandfather   . Memory loss Mother   . Gallbladder disease Brother   . Hypertension Other     Assessment & Plan  1.  Hyperlipidemia- Pt's LDL at goal but her LDL-P is elevated.  Discussed risk of ASCVD with elevated LDL-P.  Given her family history, she is interested in aggressively treating this.  Will add Zetia 10mg  daily to her simvastatin to help lower LDL and LDL-P.  Will follow up with patient in 3 months.   Evie Lacks, PharmD  09/15/2014, 3:44 PM

## 2014-09-25 ENCOUNTER — Other Ambulatory Visit: Payer: Self-pay | Admitting: Cardiology

## 2014-09-25 MED ORDER — EZETIMIBE 10 MG PO TABS: 10.0000 mg | ORAL_TABLET | Freq: Every day | ORAL | Status: DC

## 2014-10-13 ENCOUNTER — Other Ambulatory Visit: Payer: Self-pay | Admitting: Cardiology

## 2014-10-24 ENCOUNTER — Ambulatory Visit (HOSPITAL_BASED_OUTPATIENT_CLINIC_OR_DEPARTMENT_OTHER)
Admission: RE | Admit: 2014-10-24 | Discharge: 2014-10-24 | Disposition: A | Discharge status: Home / Self Care | Payer: 59 | Source: Ambulatory Visit | Attending: Physician Assistant | Admitting: Physician Assistant

## 2014-10-24 ENCOUNTER — Encounter: Payer: Self-pay | Admitting: Physician Assistant

## 2014-10-24 ENCOUNTER — Ambulatory Visit (INDEPENDENT_AMBULATORY_CARE_PROVIDER_SITE_OTHER): Payer: 59 | Admitting: Physician Assistant

## 2014-10-24 VITALS — BP 120/70 | HR 54 | Temp 97.9°F | Ht 66.0 in | Wt 209.6 lb

## 2014-10-24 DIAGNOSIS — G8929 Other chronic pain: Secondary | ICD-10-CM | POA: Insufficient documentation

## 2014-10-24 DIAGNOSIS — M25512 Pain in left shoulder: Secondary | ICD-10-CM | POA: Insufficient documentation

## 2014-10-24 DIAGNOSIS — B354 Tinea corporis: Secondary | ICD-10-CM | POA: Insufficient documentation

## 2014-10-24 DIAGNOSIS — M25812 Other specified joint disorders, left shoulder: Secondary | ICD-10-CM | POA: Insufficient documentation

## 2014-10-24 DIAGNOSIS — M67912 Unspecified disorder of synovium and tendon, left shoulder: Secondary | ICD-10-CM | POA: Diagnosis not present

## 2014-10-24 DIAGNOSIS — M67919 Unspecified disorder of synovium and tendon, unspecified shoulder: Secondary | ICD-10-CM | POA: Insufficient documentation

## 2014-10-24 MED ORDER — TERBINAFINE HCL 250 MG PO TABS: 250.0000 mg | ORAL_TABLET | Freq: Every day | ORAL | Status: DC

## 2014-10-24 MED ORDER — CLOTRIMAZOLE-BETAMETHASONE 1-0.05 % EX CREA: 1.0000 "application " | TOPICAL_CREAM | Freq: Two times a day (BID) | CUTANEOUS | Status: DC

## 2014-10-24 MED ORDER — MELOXICAM 15 MG PO TABS: 15.0000 mg | ORAL_TABLET | Freq: Every day | ORAL | Status: DC

## 2014-10-24 NOTE — Patient Instructions (Addendum)
Please go downstairs to the first floor to get an x-ray (turn left when getting off elevator). I will call you with your results.  Avoid heavy lifting or overexertion. Take Mobic daily with food to help with pain and inflammation.  Apply topical Aspercreme or Salon Pas to the area.  We will follow-up based on lab results. A Sports Medicine physician or Orthopedist may be needed.  For rash, keep skin clean and dry. Apply Lotrisone cream twice daily as directed. Take terbinafine daily over next 5 days.  Follow-up in 2 weeks. Return sooner if anything worsens.

## 2014-10-24 NOTE — Progress Notes (Signed)
Patient presents to clinic today c/o red rash of bra line, abdomen and waistline that has been present x 2 weeks. Denies change to soaps, lotions or detergents. Denies pets. Denies fever, chills, malaise. Patient denies pain or drainage from rash. Notes rash slightly scaly.  Patient also c/o chronic left pain over the past 6 months that is present mostly with rotation of shoulder. Patient endorses history of bone spur of shoulder. Denies decreased ROM. Denies numbness, tingling or extremity. Denies decreased grip strength. Denies trauma or injury of shoulder. Has not taken anything for symptoms.  Past Medical History  Diagnosis Date  . Thyroid disease   . Hyperlipidemia   . Anxiety   . Palpitations   . Chicken pox   . Diverticulitis   . Cubital tunnel syndrome   . Seasonal allergies   . Malrotation of colon 2014    Appendix/entire colon on Left  . Hypertension   . Asthma   . Arthritis   . Shoulder pain, left     Current Outpatient Prescriptions on File Prior to Visit  Medication Sig Dispense Refill  . Calcium Carb-Cholecalciferol (CALCIUM + D3) 600-200 MG-UNIT TABS Take 1 tablet by mouth 2 (two) times daily. 60 tablet 1  . ezetimibe (ZETIA) 10 MG tablet Take 1 tablet (10 mg total) by mouth daily. 90 tablet 3  . fluticasone (FLONASE) 50 MCG/ACT nasal spray Place 1 spray into both nostrils daily. (Patient taking differently: Place 1 spray into both nostrils daily as needed for allergies or rhinitis. ) 16 g 0  . ibuprofen (ADVIL,MOTRIN) 200 MG tablet Take 400 mg by mouth every 6 (six) hours as needed for headache or moderate pain.    Marland Kitchen ketotifen (ZADITOR) 0.025 % ophthalmic solution Place 1 drop into both eyes daily as needed (itching.).    Marland Kitchen levothyroxine (SYNTHROID, LEVOTHROID) 75 MCG tablet TAKE 1 TABLET DAILY BEFORE BREAKFAST 90 tablet 1  . Loratadine (CLARITIN) 10 MG CAPS Take 1 capsule (10 mg total) by mouth daily. (Patient taking differently: Take 10 mg by mouth daily as  needed (allergies. (am)). ) 30 each 2  . metoprolol tartrate (LOPRESSOR) 25 MG tablet TAKE ONE-HALF (1/2) TABLET TWICE A DAY 90 tablet 3  . Omega-3 Fatty Acids (FISH OIL PO) Take 1 tablet by mouth every morning.    . simvastatin (ZOCOR) 20 MG tablet TAKE 1 TABLET AT BEDTIME 90 tablet 3   No current facility-administered medications on file prior to visit.    Allergies  Allergen Reactions  . Augmentin [Amoxicillin-Pot Clavulanate]     "gastric issues"  . Erythromycin     "Gastric issues"    Family History  Problem Relation Age of Onset  . Heart Problems Father     cardiac stents  . Hyperlipidemia Father   . Heart Problems Mother     Palpitations  . Transient ischemic attack Mother   . Diabetes Brother   . Arthritis-Osteo Father   . Arthritis-Osteo Mother   . Arthritis Sister   . Colon cancer Maternal Grandmother   . Heart disease Paternal Grandmother   . Heart disease Paternal Grandfather   . Memory loss Mother   . Gallbladder disease Brother   . Hypertension Other     History   Social History  . Marital Status: Married    Spouse Name: N/A  . Number of Children: N/A  . Years of Education: N/A   Social History Main Topics  . Smoking status: Never Smoker   . Smokeless tobacco:  Never Used  . Alcohol Use: Yes     Comment: 1/2 bottle wine  . Drug Use: No  . Sexual Activity: Not on file   Other Topics Concern  . None   Social History Narrative   Review of Systems - See HPI.  All other ROS are negative.  BP 120/70 mmHg  Pulse 54  Temp(Src) 97.9 F (36.6 C) (Oral)  Ht 5\' 6"  (1.676 m)  Wt 209 lb 9.6 oz (95.074 kg)  BMI 33.85 kg/m2  SpO2 98%  Physical Exam  Constitutional: She is oriented to person, place, and time and well-developed, well-nourished, and in no distress.  HENT:  Head: Normocephalic and atraumatic.  Eyes: Conjunctivae are normal.  Neck: Neck supple.  Cardiovascular: Normal rate, regular rhythm, normal heart sounds and intact distal pulses.    Pulmonary/Chest: Effort normal and breath sounds normal. No respiratory distress. She has no wheezes. She has no rales. She exhibits no tenderness.  Musculoskeletal:       Left shoulder: She exhibits pain. She exhibits normal range of motion, no tenderness, no bony tenderness and normal strength.  Neurological: She is alert and oriented to person, place, and time.  Skin: Skin is warm and dry.  Tinea rash notes underneath breasts bilaterally and scattered over upper abdomen. No rash noted elsewhere.  Psychiatric: Affect normal.  Vitals reviewed.   Recent Results (from the past 2160 hour(s))  Hepatic function panel     Status: Abnormal   Collection Time: 09/08/14 10:36 AM  Result Value Ref Range   Total Bilirubin 1.4 (H) 0.2 - 1.2 mg/dL   Bilirubin, Direct 0.2 0.0 - 0.3 mg/dL   Alkaline Phosphatase 69 39 - 117 U/L   AST 19 0 - 37 U/L   ALT 18 0 - 35 U/L   Total Protein 6.8 6.0 - 8.3 g/dL   Albumin 4.1 3.5 - 5.2 g/dL  NMR Lipoprofile with Lipids     Status: Abnormal   Collection Time: 09/08/14 10:36 AM  Result Value Ref Range   LDL Particle Number 1604 (H) <1000 nmol/L    Comment:                           Low                   < 1000                           Moderate         1000 - 1299                           Borderline-High  1300 - 1599                           High             1600 - 2000                           Very High             > 2000    LDL (calc) 107 (H) 0 - 99 mg/dL    Comment:  Optimal               <  100                           Above optimal     100 -  129                           Borderline        130 -  159                           High              160 -  189                           Very high             >  189 LDL-C is inaccurate if patient is non-fasting.    HDL-C 42 >39 mg/dL   Triglycerides 161 (H) 0 - 149 mg/dL   Cholesterol, Total 096 100 - 199 mg/dL   HDL Particle Number 04.5 (L) >=30.5 umol/L   Large HDL-P 5.0  >=4.8 umol/L   Large VLDL-P 6.8 (H) <=2.7 nmol/L   Small LDL Particle Number 790 (H) <=527 nmol/L   LDL Size 20.8 >=20.8 nm   HDL Size 9.0 (L) >=9.2 nm   VLDL Size 50.6 (H) <=46.6 nm   LP-IR Score 62 (H) <=45    Comment:  ----------------------------------------------------------              INSULIN RESISTANCE / DIABETES RISK MARKERS            <--Insulin Sensitive      Insulin Resistant-->                       Percentile in Reference Population   Large VLDL-P      Low     25th     50th     75th     High                     <0.9    0.9      2.7      6.9      >6.9   Small LDL-P       Low     25th     50th     75th     High                     <117    117      527      839      >839   Large HDL-P       High    75th     50th     25th     Low                     >7.3    7.3      4.8      3.1      <3.1   VLDL Size         Small   25th     50th     75th     Large                     <  42.4   42.4     46.6     52.5     >52.5   LDL Size          Large   64QI     50th     25th     Small                     >21.2   21.2     20.8     20.4     <20.4   HDL Size          Large   34VQ     50th     25th     Small                     >9.6    9.6      9.2      8.9      <8.9   Insulin Resistance Score    LP-IR SCORE       Low     25th     50th     75th     High                     <27     27       45       63       >63    ________________________________________________________ LP-IR Score is inaccurate if patient is non-fasting. The LP-IR score is a laboratory developed index that has been associated with insulin resistance and diabetes risk and should be used as one component of a physician's clinical assessment. Neither the LP-IR score nor the subclasses listed above have been cleared by the [place] Food and Drug Administration.     Assessment/Plan: Tinea corporis Rx Lotrisone cream BID x 2 weeks. Will also Rx Terbinafine daily x 5 days to help speed resolution. Hygiene measures discussed.  Follow-up 2 weeks.  Tendinopathy of rotator cuff Will obtain x-ray of shoulder giving chronicity of symptoms and history of bone spur. Supportive measures reviewed -- Salon Pas patch and Mobic once daily. Will likely need Ortho referral.

## 2014-10-24 NOTE — Progress Notes (Signed)
Pre visit review using our clinic review tool, if applicable. No additional management support is needed unless otherwise documented below in the visit note. 

## 2014-10-24 NOTE — Assessment & Plan Note (Signed)
Will obtain x-ray of shoulder giving chronicity of symptoms and history of bone spur. Supportive measures reviewed -- Salon Pas patch and Mobic once daily. Will likely need Ortho referral.

## 2014-10-24 NOTE — Assessment & Plan Note (Signed)
Rx Lotrisone cream BID x 2 weeks. Will also Rx Terbinafine daily x 5 days to help speed resolution. Hygiene measures discussed. Follow-up 2 weeks.

## 2014-10-27 ENCOUNTER — Telehealth: Payer: Self-pay | Admitting: *Deleted

## 2014-10-27 DIAGNOSIS — M67912 Unspecified disorder of synovium and tendon, left shoulder: Secondary | ICD-10-CM

## 2014-10-27 NOTE — Telephone Encounter (Signed)
Referral placed.

## 2014-10-27 NOTE — Telephone Encounter (Signed)
-----   Message from Waldon Merl, PA-C sent at 10/24/2014  1:11 PM EDT ----- Cathlean Sauer shows degenerative changes of shoulder joints as well as some calcifications. Given chronicity of her symptoms, I recommend referral to orthopedic surgery. Please let me know if she is willing to set this up.

## 2014-10-27 NOTE — Telephone Encounter (Signed)
Called and spoke with the pt and informed her of recent lab results and note.  Pt verbalized understanding and agreed to referral.//AB/CMA

## 2014-12-08 ENCOUNTER — Other Ambulatory Visit: Payer: Self-pay

## 2014-12-08 DIAGNOSIS — Z1231 Encounter for screening mammogram for malignant neoplasm of breast: Secondary | ICD-10-CM

## 2014-12-11 ENCOUNTER — Other Ambulatory Visit (INDEPENDENT_AMBULATORY_CARE_PROVIDER_SITE_OTHER): Payer: 59 | Admitting: *Deleted

## 2014-12-11 DIAGNOSIS — E785 Hyperlipidemia, unspecified: Secondary | ICD-10-CM

## 2014-12-11 LAB — HEPATIC FUNCTION PANEL
ALT: 20 U/L (ref 0–35)
AST: 20 U/L (ref 0–37)
Albumin: 3.8 g/dL (ref 3.5–5.2)
Alkaline Phosphatase: 64 U/L (ref 39–117)
Bilirubin, Direct: 0.2 mg/dL (ref 0.0–0.3)
Total Bilirubin: 1.1 mg/dL (ref 0.2–1.2)
Total Protein: 6.4 g/dL (ref 6.0–8.3)

## 2014-12-11 LAB — LIPID PANEL
Cholesterol: 141 mg/dL (ref 0–200)
HDL: 43.2 mg/dL (ref >39.00)
LDL Cholesterol: 70 mg/dL (ref 0–99)
NonHDL: 97.76
Total CHOL/HDL Ratio: 3
Triglycerides: 139 mg/dL (ref 0.0–149.0)
VLDL: 27.8 mg/dL (ref 0.0–40.0)

## 2014-12-11 NOTE — Addendum Note (Signed)
Addended by: Tonita Phoenix on: 12/11/2014 07:39 AM   Modules accepted: Orders

## 2014-12-13 ENCOUNTER — Ambulatory Visit
Admission: RE | Admit: 2014-12-13 | Discharge: 2014-12-13 | Disposition: A | Discharge status: Home / Self Care | Payer: 59 | Source: Ambulatory Visit

## 2014-12-13 DIAGNOSIS — Z1231 Encounter for screening mammogram for malignant neoplasm of breast: Secondary | ICD-10-CM

## 2014-12-28 ENCOUNTER — Other Ambulatory Visit: Payer: Self-pay | Admitting: *Deleted

## 2014-12-28 MED ORDER — EZETIMIBE 10 MG PO TABS: 10.0000 mg | ORAL_TABLET | Freq: Every day | ORAL | Status: DC

## 2015-03-08 ENCOUNTER — Other Ambulatory Visit: Payer: Self-pay | Admitting: Physician Assistant

## 2015-03-08 ENCOUNTER — Telehealth: Payer: Self-pay | Admitting: Physician Assistant

## 2015-03-08 NOTE — Telephone Encounter (Signed)
Last filled: 09/10/14 Amt: 90, 1 Last OV:  10/24/14 Pt due for appt.  Med filled x 30 days.

## 2015-03-08 NOTE — Telephone Encounter (Signed)
Patient had Flu Shot 01/02/2015 @ [employer] (her employer)

## 2015-03-09 NOTE — Telephone Encounter (Signed)
Noted in HM. 

## 2015-03-20 ENCOUNTER — Telehealth: Payer: Self-pay

## 2015-03-20 DIAGNOSIS — E039 Hypothyroidism, unspecified: Secondary | ICD-10-CM

## 2015-03-20 NOTE — Telephone Encounter (Signed)
Called and spoke with the pt and informed her of the note below.  Pt verbalized understanding and scheduled a lab appt for (Tues. 03/27/15 @ 7:00am).  Future lab ordered and sent.//AB/CMA

## 2015-03-20 NOTE — Telephone Encounter (Signed)
She can come in for blood work. Ok to schedule for lab and order TSH.

## 2015-03-20 NOTE — Telephone Encounter (Signed)
Patient called stated her insurance company told her she needed to schedule an appointment with provider to get her mail order script for her thyroid medication. Patient wants to know if she can come in for labs or will she need an appointment? Please advise she has a 30 day supply on hand

## 2015-03-27 ENCOUNTER — Other Ambulatory Visit (INDEPENDENT_AMBULATORY_CARE_PROVIDER_SITE_OTHER): Payer: 59

## 2015-03-27 DIAGNOSIS — E039 Hypothyroidism, unspecified: Secondary | ICD-10-CM | POA: Diagnosis not present

## 2015-03-27 LAB — TSH: TSH: 2.14 u[IU]/mL (ref 0.35–4.50)

## 2015-04-10 ENCOUNTER — Other Ambulatory Visit: Payer: Self-pay | Admitting: Physician Assistant

## 2015-04-10 MED ORDER — LEVOTHYROXINE SODIUM 75 MCG PO TABS: 75.0000 ug | ORAL_TABLET | Freq: Every day | ORAL | Status: DC

## 2015-04-10 NOTE — Telephone Encounter (Signed)
Done

## 2015-04-10 NOTE — Telephone Encounter (Signed)
Pharmacy: [facility]  Reason for call: Pt needing refill on levothyroxine. Recently had labs drawn to check levels. Pt has about 1 week. Ok to have [facility].

## 2015-06-05 ENCOUNTER — Other Ambulatory Visit: Payer: Self-pay | Admitting: *Deleted

## 2015-06-05 MED ORDER — METOPROLOL TARTRATE 25 MG PO TABS: ORAL_TABLET | ORAL | Status: DC

## 2015-06-05 MED ORDER — EZETIMIBE 10 MG PO TABS
10.0000 mg | ORAL_TABLET | Freq: Every day | ORAL | Status: DC
Start: 2015-06-05 — End: 2015-09-25

## 2015-06-05 MED ORDER — SIMVASTATIN 20 MG PO TABS: 20.0000 mg | ORAL_TABLET | Freq: Every day | ORAL | Status: DC

## 2015-06-20 ENCOUNTER — Other Ambulatory Visit: Payer: Self-pay | Admitting: Physician Assistant

## 2015-09-01 ENCOUNTER — Other Ambulatory Visit: Payer: Self-pay | Admitting: Physician Assistant

## 2015-09-21 ENCOUNTER — Ambulatory Visit: Payer: Managed Care, Other (non HMO) | Admitting: Cardiology

## 2015-09-25 ENCOUNTER — Encounter: Payer: Self-pay | Admitting: Nurse Practitioner

## 2015-09-25 ENCOUNTER — Ambulatory Visit (INDEPENDENT_AMBULATORY_CARE_PROVIDER_SITE_OTHER): Payer: Managed Care, Other (non HMO) | Admitting: Nurse Practitioner

## 2015-09-25 VITALS — BP 128/76 | HR 53 | Ht 66.0 in | Wt 215.8 lb

## 2015-09-25 DIAGNOSIS — R002 Palpitations: Secondary | ICD-10-CM

## 2015-09-25 DIAGNOSIS — E785 Hyperlipidemia, unspecified: Secondary | ICD-10-CM | POA: Diagnosis not present

## 2015-09-25 DIAGNOSIS — I10 Essential (primary) hypertension: Secondary | ICD-10-CM

## 2015-09-25 LAB — LIPID PANEL
Cholesterol: 152 mg/dL (ref 125–200)
HDL: 44 mg/dL — ABNORMAL LOW (ref >=46)
LDL Cholesterol: 70 mg/dL (ref <130)
Total CHOL/HDL Ratio: 3.5 Ratio (ref <=5.0)
Triglycerides: 190 mg/dL — ABNORMAL HIGH (ref <150)
VLDL: 38 mg/dL — ABNORMAL HIGH (ref <30)

## 2015-09-25 LAB — BASIC METABOLIC PANEL
BUN: 14 mg/dL (ref 7–25)
CO2: 24 mmol/L (ref 20–31)
Calcium: 9.1 mg/dL (ref 8.6–10.4)
Chloride: 107 mmol/L (ref 98–110)
Creat: 0.89 mg/dL (ref 0.50–0.99)
Glucose, Bld: 109 mg/dL — ABNORMAL HIGH (ref 65–99)
Potassium: 4.4 mmol/L (ref 3.5–5.3)
Sodium: 140 mmol/L (ref 135–146)

## 2015-09-25 LAB — HEPATIC FUNCTION PANEL
ALT: 21 U/L (ref 6–29)
AST: 21 U/L (ref 10–35)
Albumin: 4.1 g/dL (ref 3.6–5.1)
Alkaline Phosphatase: 65 U/L (ref 33–130)
Bilirubin, Direct: 0.2 mg/dL (ref <=0.2)
Indirect Bilirubin: 1.2 mg/dL (ref 0.2–1.2)
Total Bilirubin: 1.4 mg/dL — ABNORMAL HIGH (ref 0.2–1.2)
Total Protein: 6.2 g/dL (ref 6.1–8.1)

## 2015-09-25 MED ORDER — METOPROLOL TARTRATE 25 MG PO TABS
ORAL_TABLET | ORAL | Status: DC
Start: 2015-09-25 — End: 2016-08-27

## 2015-09-25 MED ORDER — EZETIMIBE 10 MG PO TABS
10.0000 mg | ORAL_TABLET | Freq: Every day | ORAL | Status: DC
Start: 2015-09-25 — End: 2016-08-13

## 2015-09-25 MED ORDER — SIMVASTATIN 20 MG PO TABS: 20.0000 mg | ORAL_TABLET | Freq: Every day | ORAL | Status: DC

## 2015-09-25 NOTE — Patient Instructions (Addendum)
We will be checking the following labs today - BMET, lipids and HPF   Medication Instructions:    Continue with your current medicines.   I refilled your metoprolol, zetia and simvastatin    Testing/Procedures To Be Arranged:  N/A  Follow-Up:   See Dr. Delton SeeNelson in one year.     Other Special Instructions:   Exercising every day!    If you need a refill on your cardiac medications before your next appointment, please call your pharmacy.   Call the [facility] office at [phone] if you have any questions, problems or concerns.

## 2015-09-25 NOTE — Progress Notes (Signed)
CARDIOLOGY OFFICE NOTE  Date:  09/25/2015    Courtney EngKaren Oliveri Date of Birth: 1950/12/27 Medical Record #[id number]  PCP:  Piedad ClimesMartin, William Cody, PA-C  Cardiologist:  Delton SeeNelson  Chief Complaint  Patient presents with  . Palpitations  . Hyperlipidemia    1 year check - seen for Dr. Delton SeeNelson    History of Present Illness: Courtney Valenzuela is a 65 y.o. female who presents today for a one year check. Seen for Dr. Delton SeeNelson.   She has a history of hypothyroidism, hyperlipidemia and palpitations. She had a negative stress test in 2011, negative cath in 2004 and has been treated for hypercholesterolemia with Simvastatin/niacin combination. Past Holter monitor was completely normal. She was started on low dose metoprolol.  Last seen a year ago and was doing well.  Comes back today. Here alone. Needs medicines refilled. No chest pain. Has had right rotator cuff surgery about 2 months ago - still recovering from that. Walking some. Weight is up. No chest pain. Not short of breath. Not dizzy or lightheaded. No real palpitations.    Past Medical History  Diagnosis Date  . Thyroid disease   . Hyperlipidemia   . Anxiety   . Palpitations   . Chicken pox   . Diverticulitis   . Cubital tunnel syndrome   . Seasonal allergies   . Malrotation of colon 2014    Appendix/entire colon on Left  . Hypertension   . Asthma   . Arthritis   . Shoulder pain, left     Past Surgical History  Procedure Laterality Date  . Tonsillectomy    . Abdominal hysterectomy    . Shoulder surgery    . Thyroidectomy, partial    . Laproscopy      Abdominal Scar TRissue  . Wrist surgery    . Knee surgery    . Breast biopsy    . Wisdom tooth extraction    . Proctoscopy  07/24/2014    Procedure: PROCTOSCOPY;  Surgeon: Romie LeveeAlicia Thomas, MD;  Location: [facility];  Service: General;;     Medications: Current Outpatient Prescriptions  Medication Sig Dispense Refill  . Calcium Carb-Cholecalciferol (CALCIUM + D3) 600-200  MG-UNIT TABS Take 1 tablet by mouth 2 (two) times daily. 60 tablet 1  . ezetimibe (ZETIA) 10 MG tablet Take 1 tablet (10 mg total) by mouth daily. 90 tablet 3  . fluticasone (FLONASE) 50 MCG/ACT nasal spray Place 1 spray into both nostrils as needed for allergies or rhinitis.    Marland Kitchen. ibuprofen (ADVIL,MOTRIN) 200 MG tablet Take 400 mg by mouth every 6 (six) hours as needed for headache or moderate pain.    Marland Kitchen. ketotifen (ZADITOR) 0.025 % ophthalmic solution Place 1 drop into both eyes daily as needed (itching.).    Marland Kitchen. levothyroxine (SYNTHROID, LEVOTHROID) 75 MCG tablet Take 1 tablet by mouth daily. Needs OV before further refills. Due for physical in July. 90 tablet 0  . loratadine (CLARITIN) 10 MG tablet Take 10 mg by mouth daily as needed for allergies or rhinitis.    . metoprolol tartrate (LOPRESSOR) 25 MG tablet TAKE ONE-HALF (1/2) TABLET BY MOUTH TWICE A DAY 90 tablet 3  . Omega-3 Fatty Acids (FISH OIL PO) Take 1 tablet by mouth every morning.    . simvastatin (ZOCOR) 20 MG tablet Take 1 tablet (20 mg total) by mouth at bedtime. 90 tablet 3   No current facility-administered medications for this visit.    Allergies: Allergies  Allergen Reactions  . Augmentin [Amoxicillin-Pot  Clavulanate]     "gastric issues"  . Erythromycin     "Gastric issues"    Social History: The patient  reports that she has never smoked. She has never used smokeless tobacco. She reports that she drinks alcohol. She reports that she does not use illicit drugs.   Family History: The patient's family history includes Arthritis in her sister; Arthritis-Osteo in her father and mother; Colon cancer in her maternal grandmother; Diabetes in her brother; Gallbladder disease in her brother; Heart Problems in her father and mother; Heart disease in her paternal grandfather and paternal grandmother; Hyperlipidemia in her father; Hypertension in her other; Memory loss in her mother; Transient ischemic attack in her mother.    Review of Systems: Please see the history of present illness.   Otherwise, the review of systems is positive for none.   All other systems are reviewed and negative.   Physical Exam: VS:  BP 128/76 mmHg  Pulse 53  Ht 5\' 6"  (1.676 m)  Wt 215 lb 12.8 oz (97.886 kg)  BMI 34.85 kg/m2 .  BMI Body mass index is 34.85 kg/(m^2).  Wt Readings from Last 3 Encounters:  09/25/15 215 lb 12.8 oz (97.886 kg)  10/24/14 209 lb 9.6 oz (95.074 kg)  09/08/14 206 lb (93.441 kg)    General: Pleasant. Obese female who is alert and in no acute distress.  HEENT: Normal. Neck: Supple, no JVD, carotid bruits, or masses noted.  Cardiac: Regular rate and rhythm. No murmurs, rubs, or gallops. No edema.  Respiratory:  Lungs are clear to auscultation bilaterally with normal work of breathing.  GI: Soft and nontender.  MS: No deformity or atrophy. Gait and ROM intact. Skin: Warm and dry. Color is normal.  Neuro:  Strength and sensation are intact and no gross focal deficits noted.  Psych: Alert, appropriate and with normal affect.   LABORATORY DATA:  EKG:  EKG is ordered today. This demonstrates sinus bradycardia.   Lab Results  Component Value Date   WBC 9.6 07/26/2014   HGB 12.2 07/26/2014   HCT 37.7 07/26/2014   PLT 171 07/26/2014   GLUCOSE 106* 07/26/2014   CHOL 141 12/11/2014   TRIG 139.0 12/11/2014   HDL 43.20 12/11/2014   LDLCALC 70 12/11/2014   ALT 20 12/11/2014   AST 20 12/11/2014   NA 138 07/26/2014   K 3.7 07/26/2014   CL 107 07/26/2014   CREATININE 0.82 07/26/2014   BUN 11 07/26/2014   CO2 24 07/26/2014   TSH 2.14 03/27/2015   HGBA1C 5.8* 07/18/2014    BNP (last 3 results) No results for input(s): BNP in the last 8760 hours.  ProBNP (last 3 results) No results for input(s): PROBNP in the last 8760 hours.   Other Studies Reviewed Today:  Echo Study Conclusions from 2014  - Left ventricle: The cavity size was normal. Wall thickness was normal. Systolic function was  normal. The estimated ejection fraction was in the range of 60% to 65%. Wall motion was normal; there were no regional wall motion abnormalities. Features are consistent with a pseudonormal left ventricular filling pattern, with concomitant abnormal relaxation and increased filling pressure (grade 2 diastolic dysfunction). - Aortic valve: There was no stenosis. - Mitral valve: No significant regurgitation. - Left atrium: The atrium was moderately dilated. - Right ventricle: The cavity size was normal. Systolic function was normal. - Pulmonary arteries: No complete TR doppler jet so unable to estimate PA systolic pressure. - Inferior vena cava: The vessel was normal  in size; the respirophasic diameter changes were in the normal range (= 50%); findings are consistent with normal central venous pressure. Impressions:  - Normal LV size and systolic function, EF 60-65%. Normal RV size and systolic function. No significant valvular abnormalities.  Assessment/Plan: 1. HTN - BP ok on current regimen.  2. HLD - rechecking labs today. Medicines refilled.  3. Palpitations - maintained on beta blocker therapy - pretty quiescent.   4. Obesity - needs to work on diet/weight/exercise - discussed at length with her.   Current medicines are reviewed with the patient today.  The patient does not have concerns regarding medicines other than what has been noted above.  The following changes have been made:  See above.  Labs/ tests ordered today include:    Orders Placed This Encounter  Procedures  . Basic metabolic panel  . Hepatic function panel  . Lipid panel  . EKG 12-Lead     Disposition:   FU with Dr. Delton See in one year.   Patient is agreeable to this plan and will call if any problems develop in the interim.   Signed: Rosalio Macadamia, RN, ANP-C 09/25/2015 9:29 AM  [facility] [address] [place],  [place]  [address] Phone: [phone] Fax: [phone]

## 2015-12-24 IMAGING — DX DG SHOULDER 2+V*L*
3 series · 3 of 3 positions shown · non-contrast
Comparison: 12/09/2012 chest x-ray.

CLINICAL DATA: 63-year-old female chronic left shoulder pain.
History of frozen shoulder. Initial encounter.

EXAM:
LEFT SHOULDER - 2+ VIEW

[shoulder grashey]
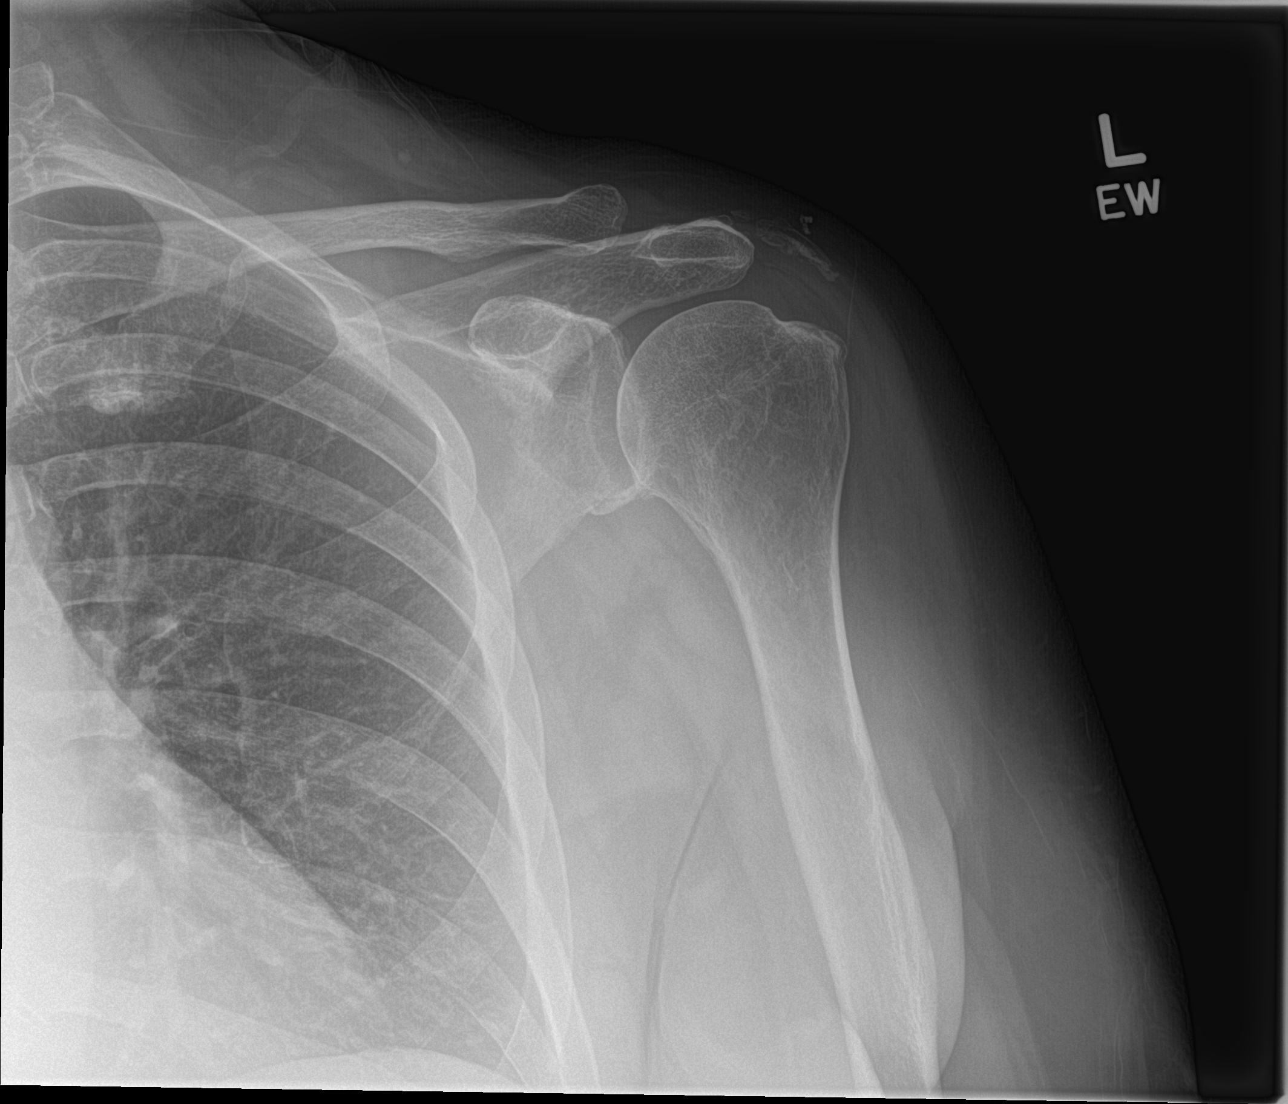

[shoulder y view]
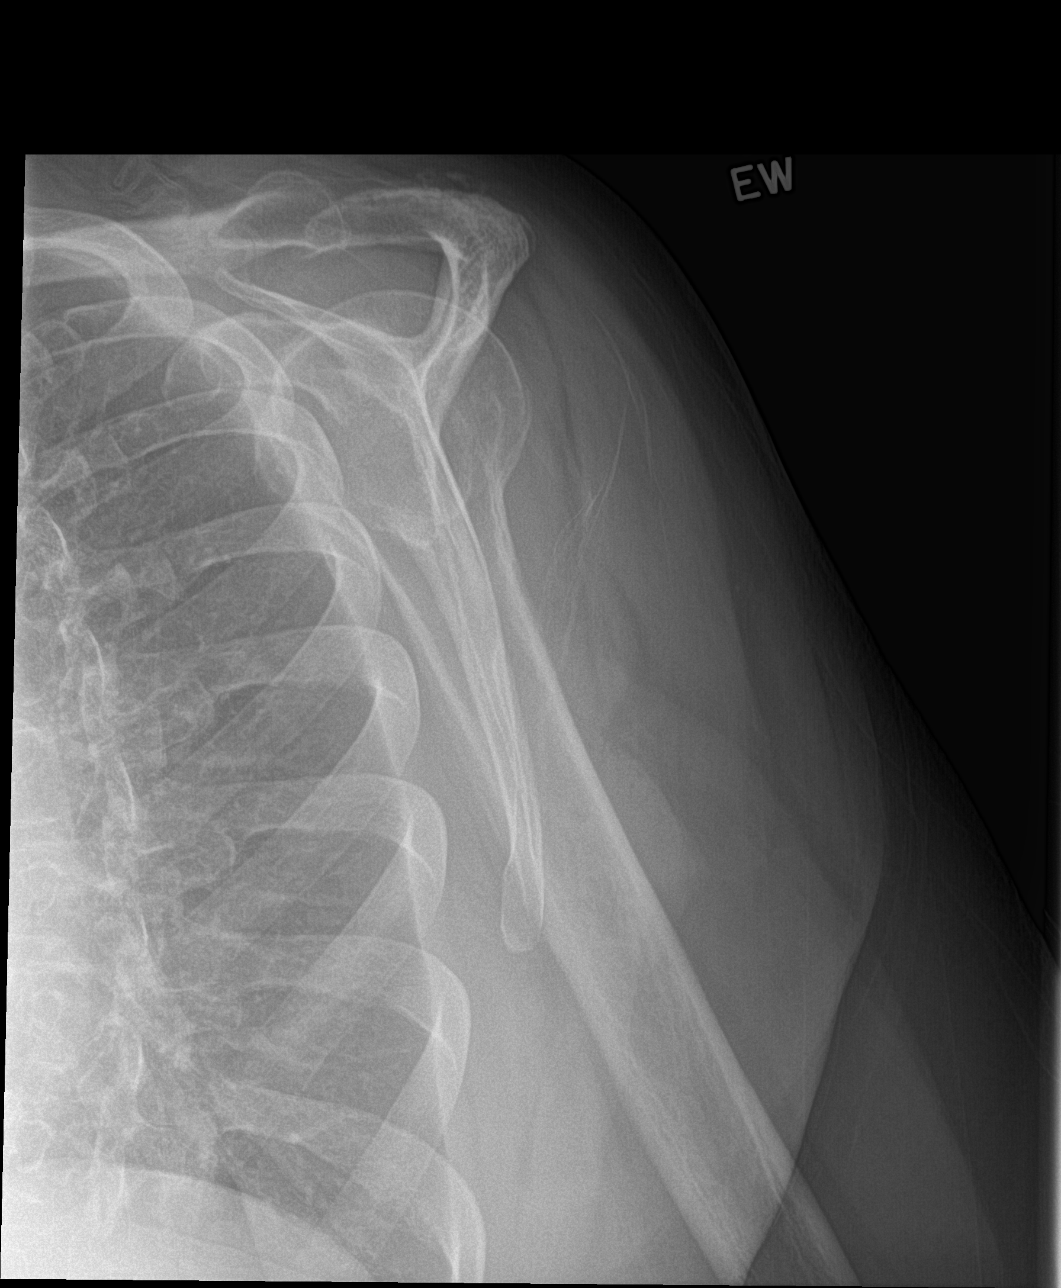

[shoulder axillary]
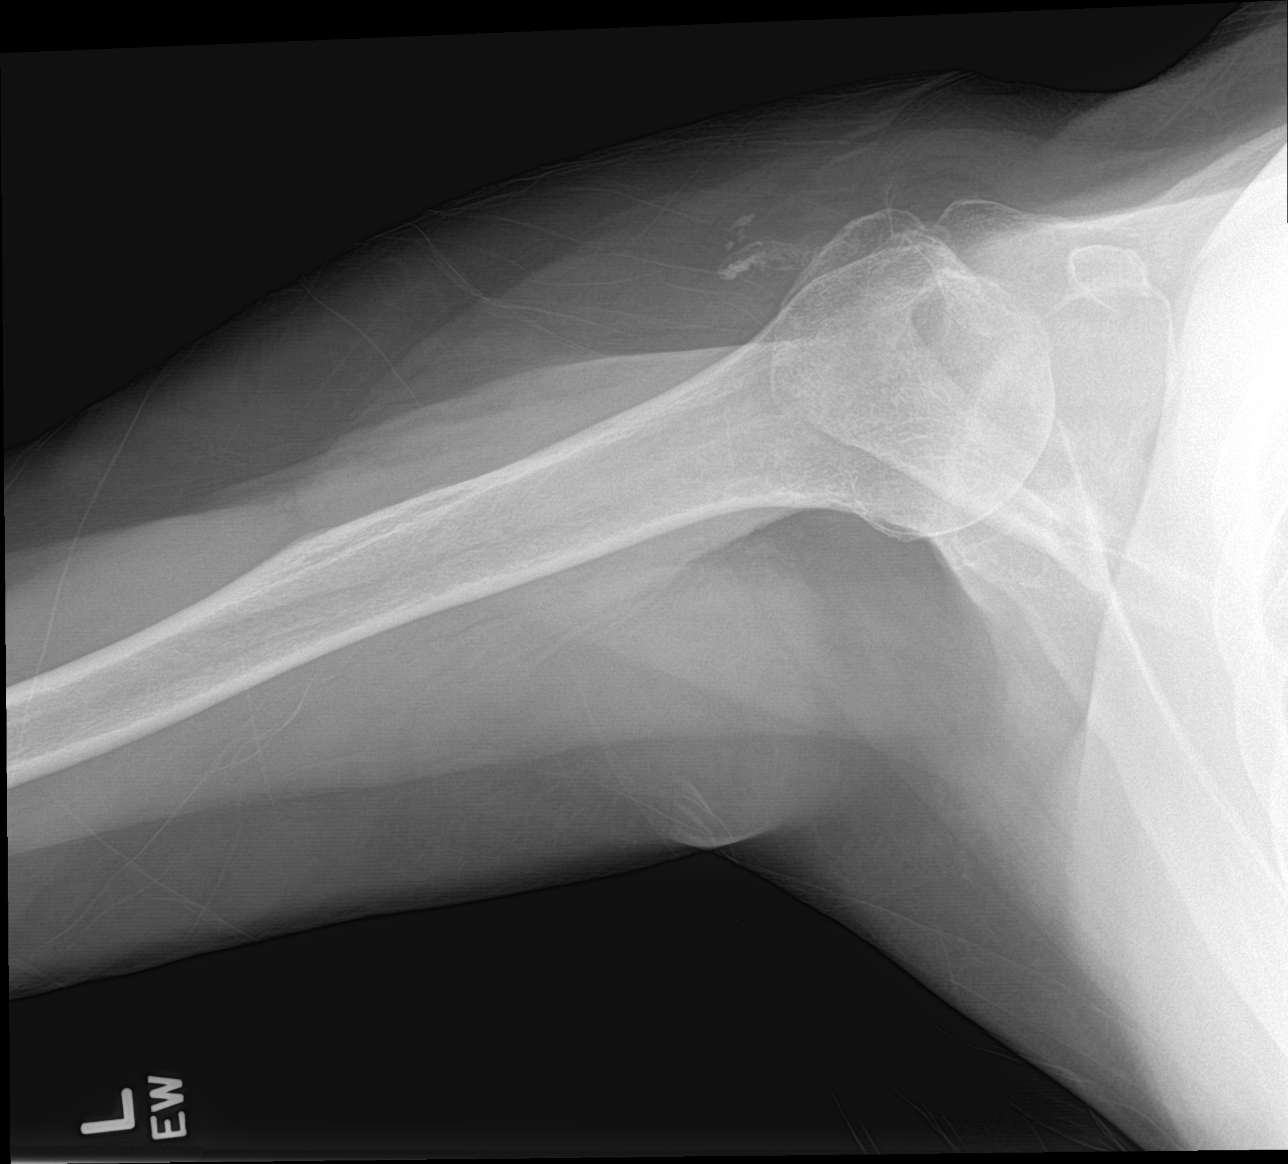

[3 of 3 positions shown; findings below may reference images not displayed]

FINDINGS: Mild acromioclavicular joint degenerative changes.

Very mild glenohumeral joint degenerative changes.

Calcifications soft tissue lateral aspect of the shoulder outside
the range of expected rotator cuff and possibly within the deltoid
muscle.

Visualized lungs clear.
IMPRESSION: Mild acromioclavicular joint degenerative changes.

Very mild glenohumeral joint degenerative changes.

Calcifications soft tissue lateral aspect of the shoulder outside
the range of expected rotator cuff and possibly within the deltoid
muscle.

## 2016-02-07 ENCOUNTER — Other Ambulatory Visit: Payer: Self-pay | Admitting: Physician Assistant

## 2016-02-08 MED ORDER — LEVOTHYROXINE SODIUM 75 MCG PO TABS: ORAL_TABLET | ORAL | 0 refills | Status: DC

## 2016-02-08 NOTE — Telephone Encounter (Addendum)
Patient was informed with last refill that a physical would be required before receiving next authorization; pt has not come in to office and per provider VO, a 2-week supply has been sent to local pharmacy/SLS 11/10  Please call patient and schedule OV before future refills will be given per provider/SLS 11/14

## 2016-02-12 ENCOUNTER — Encounter: Payer: Self-pay | Admitting: Medical

## 2016-02-12 ENCOUNTER — Ambulatory Visit (INDEPENDENT_AMBULATORY_CARE_PROVIDER_SITE_OTHER): Payer: BLUE CROSS/BLUE SHIELD | Admitting: Medical

## 2016-02-12 VITALS — BP 120/80 | HR 51 | Temp 98.1°F | Ht 66.0 in | Wt 212.6 lb

## 2016-02-12 DIAGNOSIS — J3489 Other specified disorders of nose and nasal sinuses: Secondary | ICD-10-CM

## 2016-02-12 DIAGNOSIS — R059 Cough, unspecified: Secondary | ICD-10-CM

## 2016-02-12 DIAGNOSIS — R05 Cough: Secondary | ICD-10-CM

## 2016-02-12 DIAGNOSIS — E039 Hypothyroidism, unspecified: Secondary | ICD-10-CM | POA: Diagnosis not present

## 2016-02-12 DIAGNOSIS — J309 Allergic rhinitis, unspecified: Secondary | ICD-10-CM

## 2016-02-12 DIAGNOSIS — E785 Hyperlipidemia, unspecified: Secondary | ICD-10-CM | POA: Diagnosis not present

## 2016-02-12 DIAGNOSIS — R739 Hyperglycemia, unspecified: Secondary | ICD-10-CM

## 2016-02-12 LAB — COMPREHENSIVE METABOLIC PANEL
ALBUMIN: 4.1 g/dL (ref 3.5–5.2)
ALK PHOS: 64 U/L (ref 39–117)
ALT: 22 U/L (ref 0–35)
AST: 21 U/L (ref 0–37)
BUN: 15 mg/dL (ref 6–23)
CO2: 29 mEq/L (ref 19–32)
CREATININE: 0.86 mg/dL (ref 0.40–1.20)
Calcium: 9.3 mg/dL (ref 8.4–10.5)
Chloride: 107 mEq/L (ref 96–112)
GFR: 70.58 mL/min (ref >60.00)
GLUCOSE: 101 mg/dL — AB (ref 70–99)
Potassium: 4.8 mEq/L (ref 3.5–5.1)
SODIUM: 141 meq/L (ref 135–145)
Total Bilirubin: 1.1 mg/dL (ref 0.2–1.2)
Total Protein: 6.7 g/dL (ref 6.0–8.3)

## 2016-02-12 LAB — LIPID PANEL
CHOL/HDL RATIO: 4
CHOLESTEROL: 141 mg/dL (ref 0–200)
HDL: 39.5 mg/dL (ref >39.00)
LDL CALC: 73 mg/dL (ref 0–99)
NonHDL: 101.91
Triglycerides: 146 mg/dL (ref 0.0–149.0)
VLDL: 29.2 mg/dL (ref 0.0–40.0)

## 2016-02-12 LAB — TSH: TSH: 0.9 u[IU]/mL (ref 0.35–4.50)

## 2016-02-12 LAB — T4, FREE: FREE T4: 1.07 ng/dL (ref 0.60–1.60)

## 2016-02-12 MED ORDER — BENZONATATE 100 MG PO CAPS: 100.0000 mg | ORAL_CAPSULE | Freq: Three times a day (TID) | ORAL | 0 refills | Status: DC | PRN

## 2016-02-12 MED ORDER — AZITHROMYCIN 250 MG PO TABS: ORAL_TABLET | ORAL | 0 refills | Status: DC

## 2016-02-12 MED ORDER — LEVOTHYROXINE SODIUM 75 MCG PO TABS: 75.0000 ug | ORAL_TABLET | Freq: Every day | ORAL | 3 refills | Status: DC

## 2016-02-12 MED ORDER — SIMVASTATIN 40 MG PO TABS: 40.0000 mg | ORAL_TABLET | Freq: Every day | ORAL | 3 refills | Status: DC

## 2016-02-12 MED ORDER — LEVOTHYROXINE SODIUM 75 MCG PO TABS: ORAL_TABLET | ORAL | 0 refills | Status: DC

## 2016-02-12 NOTE — Patient Instructions (Addendum)
Will check your lipid panel + thryoid level today and refill medications accordingly.  For allergie history restart flonase.  For sinus pressure and producutive cough rx benzonatate and azithromycin.(Start antibiotic if not improving within next 2 days).  Follow up date to be determined after lab review.  Also will get a1c since hyperglycemia history.

## 2016-02-12 NOTE — Progress Notes (Signed)
Pre visit review using our clinic review tool, if applicable. No additional management support is needed unless otherwise documented below in the visit note. 

## 2016-02-12 NOTE — Progress Notes (Signed)
Subjective:    Patient ID: Courtney Valenzuela, female    DOB: 01-25-1951, 65 y.o.   MRN: [id number]  HPI  Pt in for first time.   Hyperlipidemia history- Pt is fasting today. Pt admits not eating healthy. 2 weeks ago she started to make adjustments to diet.  Pt tries to walk every day. Get 7000 steps usually.   Allergic rhinitis- some nasal congestion recently. Last week some sinus pain. Some productive cough. On dayquil and nyquil. Using  flonase. No fever, no chills or sweats.  Hypothyroid history- needs new rx. Has about one week of med left.  Pt is very stressed recently.Husband just retired. Renovating a beach house.   Pt does computer work/employed. Pt hoping to retire within one year.   Review of Systems  Constitutional: Negative for chills, fatigue and fever.  HENT: Positive for congestion and sinus pain. Negative for ear pain, mouth sores, nosebleeds, postnasal drip, rhinorrhea, sore throat and trouble swallowing.   Respiratory: Positive for cough. Negative for chest tightness, shortness of breath and wheezing.   Cardiovascular: Negative for chest pain.  Gastrointestinal: Negative for abdominal pain.  Endocrine: Negative for polydipsia, polyphagia and polyuria.  Musculoskeletal: Negative for back pain.  Skin: Negative for rash.  Neurological: Negative for dizziness and headaches.  Hematological: Negative for adenopathy. Does not bruise/bleed easily.  Psychiatric/Behavioral: Negative for behavioral problems, confusion and dysphoric mood.    Past Medical History:  Diagnosis Date  . Anxiety   . Arthritis   . Asthma   . Chicken pox   . Cubital tunnel syndrome   . Diverticulitis   . Hyperlipidemia   . Hypertension   . 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 Palpitations   . Seasonal allergies   . Shoulder pain, left   . Thyroid disease      Social History   Social History  . Marital status: Married    Spouse name: N/A  . Number of children:  N/A  . Years of education: N/A   Occupational History  . Not on file.   Social History Main Topics  . Smoking status: Never Smoker  . Smokeless tobacco: Never Used  . Alcohol use Yes     Comment: 1/2 bottle wine  . Drug use: No  . Sexual activity: Not on file   Other Topics Concern  . Not on file   Social History Narrative  . No narrative on file    Past Surgical History:  Procedure Laterality Date  . ABDOMINAL HYSTERECTOMY    . BREAST BIOPSY    . KNEE SURGERY    . laproscopy     Abdominal Scar TRissue  . PROCTOSCOPY  07/24/2014   Procedure: PROCTOSCOPY;  Surgeon: Romie LeveeAlicia Thomas, MD;  Location: [facility];  Service: General;;  . SHOULDER SURGERY    . THYROIDECTOMY, PARTIAL    . TONSILLECTOMY    . WISDOM TOOTH EXTRACTION    . WRIST SURGERY      Family History  Problem Relation Age of Onset  . Heart Problems Father     cardiac stents  . Hyperlipidemia Father   . Arthritis-Osteo Father   . Heart Problems Mother     Palpitations  . Transient ischemic attack Mother   . Arthritis-Osteo Mother   . Memory loss Mother   . Diabetes Brother   . Arthritis Sister   . Colon cancer Maternal Grandmother   . Heart disease Paternal Grandmother   . Heart disease Paternal  Grandfather   . Gallbladder disease Brother   . Hypertension Other     Allergies  Allergen Reactions  . Augmentin [Amoxicillin-Pot Clavulanate]     "gastric issues"  . Erythromycin     "Gastric issues"    Current Outpatient Prescriptions on File Prior to Visit  Medication Sig Dispense Refill  . Calcium Carb-Cholecalciferol (CALCIUM + D3) 600-200 MG-UNIT TABS Take 1 tablet by mouth 2 (two) times daily. 60 tablet 1  . ezetimibe (ZETIA) 10 MG tablet Take 1 tablet (10 mg total) by mouth daily. 90 tablet 3  . fluticasone (FLONASE) 50 MCG/ACT nasal spray Place 1 spray into both nostrils as needed for allergies or rhinitis.    Marland Kitchen. ibuprofen (ADVIL,MOTRIN) 200 MG tablet Take 400 mg by mouth every 6 (six) hours as  needed for headache or moderate pain.    Marland Kitchen. ketotifen (ZADITOR) 0.025 % ophthalmic solution Place 1 drop into both eyes daily as needed (itching.).    Marland Kitchen. levothyroxine (SYNTHROID, LEVOTHROID) 75 MCG tablet Take 1 tablet by mouth daily. Needs OV before further refills. Due for physical in July. 15 tablet 0  . loratadine (CLARITIN) 10 MG tablet Take 10 mg by mouth daily as needed for allergies or rhinitis.    . metoprolol tartrate (LOPRESSOR) 25 MG tablet TAKE ONE-HALF (1/2) TABLET BY MOUTH TWICE A DAY 90 tablet 3  . Omega-3 Fatty Acids (FISH OIL PO) Take 1 tablet by mouth every morning.    . simvastatin (ZOCOR) 20 MG tablet Take 1 tablet (20 mg total) by mouth at bedtime. 90 tablet 3   No current facility-administered medications on file prior to visit.     BP 120/80 (BP Location: Left Arm, Patient Position: Sitting, Cuff Size: Normal)   Pulse (!) 51   Temp 98.1 F (36.7 C) (Oral)   Ht 5\' 6"  (1.676 m)   Wt 212 lb 9.6 oz (96.4 kg)   SpO2 98%   BMI 34.31 kg/m       Objective:   Physical Exam  General  Mental Status - Alert. General Appearance - Well groomed. Not in acute distress.  Skin Rashes- No Rashes.  HEENT Head- Normal. Ear Auditory Canal - Left- Normal. Right - Normal.Tympanic Membrane- Left- Normal. Right- Normal. Eye Sclera/Conjunctiva- Left- Normal. Right- Normal. Nose & Sinuses Nasal Mucosa- Left-  Boggy and Congested. Right-  Boggy and  Congested.Bilateral maxillary and frontal sinus pressure. Mouth & Throat Lips: Upper Lip- Normal: no dryness, cracking, pallor, cyanosis, or vesicular eruption. Lower Lip-Normal: no dryness, cracking, pallor, cyanosis or vesicular eruption. Buccal Mucosa- Bilateral- No Aphthous ulcers. Oropharynx- No Discharge or Erythema. Tonsils: Characteristics- Bilateral- No Erythema or Congestion. Size/Enlargement- Bilateral- No enlargement. Discharge- bilateral-None.  Neck Neck- Supple. No Masses.   Chest and Lung  Exam Auscultation: Breath Sounds:-Clear even and unlabored.  Cardiovascular Auscultation:Rythm- Regular, rate and rhythm. Murmurs & Other Heart Sounds:Ausculatation of the heart reveal- No Murmurs.  Lymphatic Head & Neck General Head & Neck Lymphatics: Bilateral: Description- No Localized lymphadenopathy.       Assessment & Plan:  Will check your lipid panel + thryoid level today and refill medications accordingly.  For allergie history restart flonase.  For sinus pressure and producutive cough rx benzonatate and azithromycin.(Start antibiotic if not improving within next 2 days).  Follow up date to be determined after lab review.  Pt upset I was late. Apologized. I advised early appointment am and pm  best chance that I won't be late. Explained often time complaints such as chest  pain scheduled in 15 minute slot will cause me to run behind as complaints of serious take more time. Also if pt have questions I try to address each concern.

## 2016-02-12 NOTE — Telephone Encounter (Signed)
I ordered a1c today. So will you please run that if possible with blood that was drawn today.

## 2016-02-13 ENCOUNTER — Other Ambulatory Visit: Payer: Self-pay | Admitting: *Deleted

## 2016-02-13 NOTE — Progress Notes (Signed)
Duplicate Medication correction/SLS

## 2016-02-14 NOTE — Telephone Encounter (Signed)
Called patient to schedule appointment for a follow up and patient states she is not coming into the office every 6 months because for the past 20 years she has only been seen once a year. States she does not believe that she needs to be seen every 6 months in order to get her medications refilled and if she does she will stop taking the medication.

## 2016-02-15 NOTE — Telephone Encounter (Signed)
Dr. Zola ButtonLowne-Chase. Please see below and advise.

## 2016-02-20 MED ORDER — SIMVASTATIN 40 MG PO TABS: 40.0000 mg | ORAL_TABLET | Freq: Every day | ORAL | 3 refills | Status: DC

## 2016-02-20 MED ORDER — LEVOTHYROXINE SODIUM 75 MCG PO TABS: 75.0000 ug | ORAL_TABLET | Freq: Every day | ORAL | 3 refills | Status: DC

## 2016-02-20 NOTE — Telephone Encounter (Signed)
This message is just coming to me, it was in an Rx Result not something I normally get, but unable to add on A1c because there is no tube to add it too and to late now. KMP

## 2016-02-20 NOTE — Telephone Encounter (Signed)
Refilled pt thyroid and cholesterol med. Sent to mail order pharmacy.

## 2016-02-26 NOTE — Telephone Encounter (Signed)
Has anyone been able to speak to her -- she needs an appointment  After appointment made refill until appointment or for 1 month which ever is further out

## 2016-08-13 ENCOUNTER — Other Ambulatory Visit: Payer: Self-pay | Admitting: Nurse Practitioner

## 2016-08-27 ENCOUNTER — Other Ambulatory Visit: Payer: Self-pay | Admitting: Nurse Practitioner

## 2016-11-07 ENCOUNTER — Encounter: Payer: Self-pay | Admitting: Cardiology

## 2016-11-07 ENCOUNTER — Ambulatory Visit (INDEPENDENT_AMBULATORY_CARE_PROVIDER_SITE_OTHER): Payer: BLUE CROSS/BLUE SHIELD | Admitting: Cardiology

## 2016-11-07 VITALS — BP 124/80 | HR 60 | Ht 66.0 in | Wt 212.0 lb

## 2016-11-07 DIAGNOSIS — E782 Mixed hyperlipidemia: Secondary | ICD-10-CM | POA: Diagnosis not present

## 2016-11-07 DIAGNOSIS — I10 Essential (primary) hypertension: Secondary | ICD-10-CM | POA: Diagnosis not present

## 2016-11-07 DIAGNOSIS — R002 Palpitations: Secondary | ICD-10-CM

## 2016-11-07 MED ORDER — EZETIMIBE 10 MG PO TABS: 10.0000 mg | ORAL_TABLET | Freq: Every day | ORAL | 3 refills | Status: DC

## 2016-11-07 MED ORDER — METOPROLOL TARTRATE 25 MG PO TABS: ORAL_TABLET | ORAL | 3 refills | Status: DC

## 2016-11-07 MED ORDER — SIMVASTATIN 40 MG PO TABS: 40.0000 mg | ORAL_TABLET | Freq: Every day | ORAL | 3 refills | Status: DC

## 2016-11-07 NOTE — Progress Notes (Signed)
CARDIOLOGY OFFICE NOTE  Date:  11/07/2016    Courtney EngKaren Valenzuela Date of Birth: 06/25/1950 Medical Record #[id number]  PCP:  Waldon MerlMartin, William C, PA-C  Cardiologist:  Delton SeeNelson  Chief complain: 1 year follow up  History of Present Illness: Courtney Valenzuela is a 66 y.o. female who presents today for a one year check. She has a history of hypothyroidism, hyperlipidemia and palpitations. She had a negative stress test in 2011, negative cath in 2004 and has been treated for hypercholesterolemia with Simvastatin/niacin combination. Past Holter monitor was completely normal. She was started on low dose metoprolol.  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 [place]. She has a sedentary job but tries to walk every day at work. She doesn't have time to exercise but while walking she hasn't noticed any worsening dyspnea on exertion and no chest pain. She denies palpitations, no claudications no lower extremity edema. No dizziness or falls.   Past Medical History:  Diagnosis Date  . Anxiety   . Arthritis   . Asthma   . Chicken pox   . Cubital tunnel syndrome   . Diverticulitis   . Hyperlipidemia   . Hypertension   . 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 Palpitations   . Seasonal allergies   . Shoulder pain, left   . Thyroid disease     Past Surgical History:  Procedure Laterality Date  . ABDOMINAL HYSTERECTOMY    . BREAST BIOPSY    . KNEE SURGERY    . laproscopy     Abdominal Scar TRissue  . PROCTOSCOPY  07/24/2014   Procedure: PROCTOSCOPY;  Surgeon: Romie LeveeAlicia Thomas, MD;  Location: [facility];  Service: General;;  . SHOULDER SURGERY    . THYROIDECTOMY, PARTIAL    . TONSILLECTOMY    . WISDOM TOOTH EXTRACTION    . WRIST SURGERY       Medications: Current Outpatient Prescriptions  Medication Sig  Dispense Refill  . ezetimibe (ZETIA) 10 MG tablet Take 1 tablet (10 mg total) by mouth daily. 90 tablet 3  . fluticasone (FLONASE) 50 MCG/ACT nasal spray Place 1 spray into both nostrils as needed for allergies or rhinitis.    Marland Kitchen. ibuprofen (ADVIL,MOTRIN) 200 MG tablet Take 400 mg by mouth every 6 (six) hours as needed for headache or moderate pain.    Marland Kitchen. ketotifen (ZADITOR) 0.025 % ophthalmic solution Place 1 drop into both eyes daily as needed (itching.).    Marland Kitchen. levothyroxine (SYNTHROID, LEVOTHROID) 75 MCG tablet Take 1 tablet (75 mcg total) by mouth daily before breakfast. 90 tablet 3  . loratadine (CLARITIN) 10 MG tablet Take 10 mg by mouth daily as needed for allergies or rhinitis.    . metoprolol tartrate (LOPRESSOR) 25 MG tablet TAKE ONE-HALF (1/2) TABLET TWICE A DAY 90 tablet 3  . simvastatin (ZOCOR) 40 MG tablet Take 1 tablet (40 mg total) by mouth at bedtime. 90 tablet 3   No current facility-administered medications for this visit.     Allergies: Allergies  Allergen Reactions  . Augmentin [Amoxicillin-Pot Clavulanate]     "gastric issues"  . Erythromycin     "Gastric issues"    Social History: The patient  reports that she has never smoked. She has never used smokeless tobacco. She reports that she  drinks alcohol. She reports that she does not use drugs.   Family History: The patient's family history includes Arthritis in her sister; Arthritis-Osteo in her father and mother; Colon cancer in her maternal grandmother; Diabetes in her brother; Gallbladder disease in her brother; Heart Problems in her father and mother; Heart disease in her paternal grandfather and paternal grandmother; Hyperlipidemia in her father; Hypertension in her other; Memory loss in her mother; Transient ischemic attack in her mother.   Review of Systems: Please see the history of present illness.   Otherwise, the review of systems is positive for none.   All other systems are reviewed and negative.    Physical Exam: VS:  BP 124/80   Pulse 60   Ht 5\' 6"  (1.676 m)   Wt 212 lb (96.2 kg)   BMI 34.22 kg/m  .  BMI Body mass index is 34.22 kg/m.  Wt Readings from Last 3 Encounters:  11/07/16 212 lb (96.2 kg)  02/12/16 212 lb 9.6 oz (96.4 kg)  09/25/15 215 lb 12.8 oz (97.9 kg)    General: Pleasant. Obese female who is alert and in no acute distress.   HEENT: Normal.  Neck: Supple, no JVD, carotid bruits, or masses noted.  Cardiac: Regular rate and rhythm. No murmurs, rubs, or gallops. No edema.  Respiratory:  Lungs are clear to auscultation bilaterally with normal work of breathing.  GI: Soft and nontender.  MS: No deformity or atrophy. Gait and ROM intact.  Skin: Warm and dry. Color is normal.  Neuro:  Strength and sensation are intact and no gross focal deficits noted.  Psych: Alert, appropriate and with normal affect.   LABORATORY DATA:  EKG:  EKG is ordered today. This demonstrates sinus bradycardia.   Lab Results  Component Value Date   WBC 9.6 07/26/2014   HGB 12.2 07/26/2014   HCT 37.7 07/26/2014   PLT 171 07/26/2014   GLUCOSE 101 (H) 02/12/2016   CHOL 141 02/12/2016   TRIG 146.0 02/12/2016   HDL 39.50 02/12/2016   LDLCALC 73 02/12/2016   ALT 22 02/12/2016   AST 21 02/12/2016   NA 141 02/12/2016   K 4.8 02/12/2016   CL 107 02/12/2016   CREATININE 0.86 02/12/2016   BUN 15 02/12/2016   CO2 29 02/12/2016   TSH 0.90 02/12/2016   HGBA1C 5.8 (H) 07/18/2014    BNP (last 3 results) No results for input(s): BNP in the last 8760 hours.  ProBNP (last 3 results) No results for input(s): PROBNP in the last 8760 hours.   Other Studies Reviewed Today:  Echo Study Conclusions from 2014  - Left ventricle: The cavity size was normal. Wall thickness was normal. Systolic function was normal. The estimated ejection fraction was in the range of 60% to 65%. Wall motion was normal; there were no regional wall motion abnormalities. Features are consistent  with a pseudonormal left ventricular filling pattern, with concomitant abnormal relaxation and increased filling pressure (grade 2 diastolic dysfunction). - Aortic valve: There was no stenosis. - Mitral valve: No significant regurgitation. - Left atrium: The atrium was moderately dilated. - Right ventricle: The cavity size was normal. Systolic function was normal. - Pulmonary arteries: No complete TR doppler jet so unable to estimate PA systolic pressure. - Inferior vena cava: The vessel was normal in size; the respirophasic diameter changes were in the normal range (= 50%); findings are consistent with normal central venous pressure. Impressions:  - Normal LV size and systolic function, EF 60-65%. Normal RV  size and systolic function. No significant valvular abnormalities.    Assessment/Plan:  1. Hypertension - controlled with no side effects of her medications will continue.  2. HLD - lipids at goal in November 2017 and recently checked at work for insurance purposes and all at goal we will repeat in 1 year.  3. Palpitations - maintained on beta blocker therapy, she is now asymptomatic.   4. Obesity - needs to work on diet/weight/exercise - discussed with her, encouraged to continue walking.   Current medicines are reviewed with the patient today.  The patient does not have concerns regarding medicines other than what has been noted above.  The following changes have been made:  See above.  Labs/ tests ordered today include:   Orders Placed This Encounter  Procedures  . EKG 12-Lead   Disposition:   FU with Dr. Delton See in one year.   Signed: Tobias Alexander, MD 11/07/2016 9:41 AM

## 2016-11-07 NOTE — Patient Instructions (Signed)
Your physician recommends that you continue on your current medications as directed. Please refer to the Current Medication list given to you today.   Your physician wants you to follow-up in: Year with Dr Johnell ComingsNelson You will receive a reminder letter in the mail two months in advance. If you don't receive a letter, please call our office to schedule the follow-up appointment.

## 2017-02-10 LAB — HM MAMMOGRAPHY

## 2017-02-16 ENCOUNTER — Other Ambulatory Visit: Payer: Self-pay | Admitting: Medical

## 2017-02-18 ENCOUNTER — Encounter: Payer: Self-pay | Admitting: Emergency Medicine

## 2017-02-24 ENCOUNTER — Encounter: Payer: Self-pay | Admitting: Physician Assistant

## 2017-02-24 ENCOUNTER — Ambulatory Visit (INDEPENDENT_AMBULATORY_CARE_PROVIDER_SITE_OTHER): Payer: BLUE CROSS/BLUE SHIELD | Admitting: Physician Assistant

## 2017-02-24 ENCOUNTER — Other Ambulatory Visit: Payer: Self-pay

## 2017-02-24 VITALS — BP 130/80 | HR 52 | Temp 98.4°F | Resp 14 | Ht 66.0 in | Wt 214.0 lb

## 2017-02-24 DIAGNOSIS — Z0000 Encounter for general adult medical examination without abnormal findings: Secondary | ICD-10-CM

## 2017-02-24 DIAGNOSIS — E039 Hypothyroidism, unspecified: Secondary | ICD-10-CM

## 2017-02-24 DIAGNOSIS — E785 Hyperlipidemia, unspecified: Secondary | ICD-10-CM

## 2017-02-24 DIAGNOSIS — Z23 Encounter for immunization: Secondary | ICD-10-CM | POA: Diagnosis not present

## 2017-02-24 LAB — COMPREHENSIVE METABOLIC PANEL
ALT: 19 U/L (ref 0–35)
AST: 22 U/L (ref 0–37)
Albumin: 4.1 g/dL (ref 3.5–5.2)
Alkaline Phosphatase: 61 U/L (ref 39–117)
BILIRUBIN TOTAL: 2 mg/dL — AB (ref 0.2–1.2)
BUN: 16 mg/dL (ref 6–23)
CO2: 27 meq/L (ref 19–32)
Calcium: 9.4 mg/dL (ref 8.4–10.5)
Chloride: 106 mEq/L (ref 96–112)
Creatinine, Ser: 0.88 mg/dL (ref 0.40–1.20)
GFR: 68.51 mL/min (ref >60.00)
GLUCOSE: 116 mg/dL — AB (ref 70–99)
Potassium: 4.6 mEq/L (ref 3.5–5.1)
Sodium: 140 mEq/L (ref 135–145)
Total Protein: 6.4 g/dL (ref 6.0–8.3)

## 2017-02-24 LAB — LIPID PANEL
CHOL/HDL RATIO: 4
CHOLESTEROL: 134 mg/dL (ref 0–200)
HDL: 37.9 mg/dL — AB (ref >39.00)
LDL Cholesterol: 61 mg/dL (ref 0–99)
NonHDL: 95.6
TRIGLYCERIDES: 171 mg/dL — AB (ref 0.0–149.0)
VLDL: 34.2 mg/dL (ref 0.0–40.0)

## 2017-02-24 LAB — CBC WITH DIFFERENTIAL/PLATELET
BASOS ABS: 0.1 10*3/uL (ref 0.0–0.1)
Basophils Relative: 1.3 % (ref 0.0–3.0)
EOS ABS: 0.1 10*3/uL (ref 0.0–0.7)
Eosinophils Relative: 2 % (ref 0.0–5.0)
HCT: 43.8 % (ref 36.0–46.0)
Hemoglobin: 14.5 g/dL (ref 12.0–15.0)
LYMPHS ABS: 1.5 10*3/uL (ref 0.7–4.0)
Lymphocytes Relative: 31.1 % (ref 12.0–46.0)
MCHC: 33.2 g/dL (ref 30.0–36.0)
MCV: 88.1 fl (ref 78.0–100.0)
MONO ABS: 0.4 10*3/uL (ref 0.1–1.0)
Monocytes Relative: 8.5 % (ref 3.0–12.0)
NEUTROS ABS: 2.8 10*3/uL (ref 1.4–7.7)
NEUTROS PCT: 57.1 % (ref 43.0–77.0)
PLATELETS: 181 10*3/uL (ref 150.0–400.0)
RBC: 4.97 Mil/uL (ref 3.87–5.11)
RDW: 13.7 % (ref 11.5–15.5)
WBC: 4.8 10*3/uL (ref 4.0–10.5)

## 2017-02-24 LAB — TSH: TSH: 2.08 u[IU]/mL (ref 0.35–4.50)

## 2017-02-24 NOTE — Assessment & Plan Note (Signed)
Repeat TSH today. Will alter dosing of levothyroxine accordingly.

## 2017-02-24 NOTE — Addendum Note (Signed)
Addended by: Con MemosMOORE, Khan Chura S on: 02/24/2017 09:14 AM   Modules accepted: Orders

## 2017-02-24 NOTE — Progress Notes (Signed)
Patient presents to clinic today for annual exam.  Patient is fasting for labs. Diet -- Endorses well-balanced diet overall. Good water intake. Exercise -- 30 minutes of aerobic exercise most days of the week.  Acute Concerns: Denies acute concerns at today's visit.   Chronic Issues: Hyperlipidemia -- Followed by Cardiology. Is currently on a combination of Zetia and Zocor, prescribed by Cardiology. Is doing very well on this regimen.   Hypothyroidism -- Currently on levothyroxine 75 mcg daily. Is taking as directed. Previously well-controlled on this regimen.  Health Maintenance: Immunizations -- Flu and Tetanus up-to-date. Prevnar due today. Agrees to have today. Colonoscopy -- Up-to-date. 5 year recall. Due next July Mammogram -- Up-to-date. PAP -- s/p hysterectomy  Past Medical History:  Diagnosis Date  . Anxiety   . Arthritis   . Asthma   . Chicken pox   . Cubital tunnel syndrome   . Diverticulitis   . Hyperlipidemia   . Hypertension   . 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 Palpitations   . Seasonal allergies   . Shoulder pain, left   . Thyroid disease     Past Surgical History:  Procedure Laterality Date  . ABDOMINAL HYSTERECTOMY    . BREAST BIOPSY    . KNEE SURGERY    . laproscopy     Abdominal Scar TRissue  . PROCTOSCOPY  07/24/2014   Procedure: PROCTOSCOPY;  Surgeon: Romie LeveeAlicia Thomas, MD;  Location: [facility];  Service: General;;  . SHOULDER SURGERY    . THYROIDECTOMY, PARTIAL    . TONSILLECTOMY    . WISDOM TOOTH EXTRACTION    . WRIST SURGERY      Current Outpatient Medications on File Prior to Visit  Medication Sig Dispense Refill  . ezetimibe (ZETIA) 10 MG tablet Take 1 tablet (10 mg total) by mouth daily. 90 tablet 3  . fluticasone (FLONASE) 50 MCG/ACT nasal spray Place 1 spray into both nostrils as needed for allergies or rhinitis.    Marland Kitchen. ibuprofen (ADVIL,MOTRIN) 200 MG tablet Take 400 mg by mouth every 6 (six) hours as needed  for headache or moderate pain.    Marland Kitchen. ketotifen (ZADITOR) 0.025 % ophthalmic solution Place 1 drop into both eyes daily as needed (itching.).    Marland Kitchen. levothyroxine (SYNTHROID, LEVOTHROID) 75 MCG tablet Take 1 tablet (75 mcg total) by mouth daily before breakfast. 90 tablet 3  . loratadine (CLARITIN) 10 MG tablet Take 10 mg by mouth daily as needed for allergies or rhinitis.    . metoprolol tartrate (LOPRESSOR) 25 MG tablet TAKE ONE-HALF (1/2) TABLET TWICE A DAY 90 tablet 3  . simvastatin (ZOCOR) 40 MG tablet Take 1 tablet (40 mg total) by mouth at bedtime. 90 tablet 3   No current facility-administered medications on file prior to visit.     Allergies  Allergen Reactions  . Augmentin [Amoxicillin-Pot Clavulanate]     "gastric issues"  . Erythromycin     "Gastric issues"    Family History  Problem Relation Age of Onset  . Heart Problems Father        cardiac stents  . Hyperlipidemia Father   . Arthritis-Osteo Father   . Heart Problems Mother        Palpitations  . Transient ischemic attack Mother   . Arthritis-Osteo Mother   . Memory loss Mother   . Dementia Mother   . Diabetes Brother   . Arthritis Sister   . Colon cancer Maternal Grandmother   .  Heart disease Paternal Grandmother   . Heart disease Paternal Grandfather   . Gallbladder disease Brother   . Hypertension Other     Social History   Socioeconomic History  . Marital status: Married    Spouse name: Not on file  . Number of children: Not on file  . Years of education: Not on file  . Highest education level: Not on file  Social Needs  . Financial resource strain: Not on file  . Food insecurity - worry: Not on file  . Food insecurity - inability: Not on file  . Transportation needs - medical: Not on file  . Transportation needs - non-medical: Not on file  Occupational History  . Not on file  Tobacco Use  . Smoking status: Never Smoker  . Smokeless tobacco: Never Used  Substance and Sexual Activity  .  Alcohol use: Yes    Comment: 1/2 bottle wine  . Drug use: No  . Sexual activity: Not on file  Other Topics Concern  . Not on file  Social History Narrative  . Not on file    Review of Systems  Constitutional: Negative for fever and weight loss.  HENT: Negative for ear discharge, ear pain, hearing loss and tinnitus.   Eyes: Negative for blurred vision, double vision, photophobia and pain.  Respiratory: Negative for cough and shortness of breath.   Cardiovascular: Negative for chest pain and palpitations.  Gastrointestinal: Negative for abdominal pain, blood in stool, constipation, diarrhea, heartburn, melena, nausea and vomiting.  Genitourinary: Negative for dysuria, flank pain, frequency, hematuria and urgency.  Musculoskeletal: Negative for falls.  Neurological: Negative for dizziness, loss of consciousness and headaches.  Endo/Heme/Allergies: Negative for environmental allergies.  Psychiatric/Behavioral: Negative for depression, hallucinations, substance abuse and suicidal ideas. The patient is not nervous/anxious and does not have insomnia.    BP 130/80   Pulse (!) 52   Temp 98.4 F (36.9 C) (Oral)   Resp 14   Ht 5\' 6"  (1.676 m)   Wt 214 lb (97.1 kg)   SpO2 96%   BMI 34.54 kg/m   Physical Exam  Constitutional: She is oriented to person, place, and time and well-developed, well-nourished, and in no distress.  HENT:  Head: Normocephalic and atraumatic.  Right Ear: Tympanic membrane, external ear and ear canal normal.  Left Ear: Tympanic membrane, external ear and ear canal normal.  Nose: Nose normal. No mucosal edema.  Mouth/Throat: Uvula is midline, oropharynx is clear and moist and mucous membranes are normal. No oropharyngeal exudate or posterior oropharyngeal erythema.  Eyes: Conjunctivae are normal. Pupils are equal, round, and reactive to light.  Neck: Neck supple. No thyromegaly present.  Cardiovascular: Normal rate, regular rhythm, normal heart sounds and intact  distal pulses.  Pulmonary/Chest: Effort normal and breath sounds normal. No respiratory distress. She has no wheezes. She has no rales.  Abdominal: Soft. Bowel sounds are normal. She exhibits no distension and no mass. There is no tenderness. There is no rebound and no guarding.  Lymphadenopathy:    She has no cervical adenopathy.  Neurological: She is alert and oriented to person, place, and time. No cranial nerve deficit.  Skin: Skin is warm and dry. No rash noted.  Psychiatric: Affect normal.  Vitals reviewed.  Recent Results (from the past 2160 hour(s))  [facility] MAMMOGRAPHY     Status: None   Collection Time: 02/10/17 12:00 AM  Result Value Ref Range   [facility] Mammogram 0-4 Bi-Rad 0-4 Bi-Rad, Self Reported Normal  Assessment/Plan: Hypothyroidism Repeat TSH today. Will alter dosing of levothyroxine accordingly.   Visit for preventive health examination Depression screen negative. Health Maintenance reviewed -- Prevnar due. Given today. Other parameters up-to-date. Preventive schedule discussed and handout given in AVS. Will obtain fasting labs today.    Hyperlipidemia Repeat lipids and LFTs today to assess. Continue care directed by Cardiology.    Piedad ClimesWilliam Cody Reyonna Haack, PA-C

## 2017-02-24 NOTE — Assessment & Plan Note (Signed)
Depression screen negative. Health Maintenance reviewed -- Prevnar due. Given today. Other parameters up-to-date. Preventive schedule discussed and handout given in AVS. Will obtain fasting labs today.

## 2017-02-24 NOTE — Assessment & Plan Note (Signed)
Repeat lipids and LFTs today to assess. Continue care directed by Cardiology.

## 2017-02-24 NOTE — Patient Instructions (Signed)
Please go to the lab for blood work.   Our office will call you with your results unless you have chosen to receive results via MyChart.  If your blood work is normal we will follow-up each year for physicals and as scheduled for chronic medical problems.  If anything is abnormal we will treat accordingly and get you in for a follow-up.  Please continue medications as directed.   Preventive Care 65 Years and Older, Female Preventive care refers to lifestyle choices and visits with your health care provider that can promote health and wellness. What does preventive care include?  A yearly physical exam. This is also called an annual well check.  Dental exams once or twice a year.  Routine eye exams. Ask your health care provider how often you should have your eyes checked.  Personal lifestyle choices, including: ? Daily care of your teeth and gums. ? Regular physical activity. ? Eating a healthy diet. ? Avoiding tobacco and drug use. ? Limiting alcohol use. ? Practicing safe sex. ? Taking low-dose aspirin every day. ? Taking vitamin and mineral supplements as recommended by your health care provider. What happens during an annual well check? The services and screenings done by your health care provider during your annual well check will depend on your age, overall health, lifestyle risk factors, and family history of disease. Counseling Your health care provider may ask you questions about your:  Alcohol use.  Tobacco use.  Drug use.  Emotional well-being.  Home and relationship well-being.  Sexual activity.  Eating habits.  History of falls.  Memory and ability to understand (cognition).  Work and work Statistician.  Reproductive health.  Screening You may have the following tests or measurements:  Height, weight, and BMI.  Blood pressure.  Lipid and cholesterol levels. These may be checked every 5 years, or more frequently if you are over 22 years  old.  Skin check.  Lung cancer screening. You may have this screening every year starting at age 2 if you have a 30-pack-year history of smoking and currently smoke or have quit within the past 15 years.  Fecal occult blood test (FOBT) of the stool. You may have this test every year starting at age 33.  Flexible sigmoidoscopy or colonoscopy. You may have a sigmoidoscopy every 5 years or a colonoscopy every 10 years starting at age 102.  Hepatitis C blood test.  Hepatitis B blood test.  Sexually transmitted disease (STD) testing.  Diabetes screening. This is done by checking your blood sugar (glucose) after you have not eaten for a while (fasting). You may have this done every 1-3 years.  Bone density scan. This is done to screen for osteoporosis. You may have this done starting at age 28.  Mammogram. This may be done every 1-2 years. Talk to your health care provider about how often you should have regular mammograms.  Talk with your health care provider about your test results, treatment options, and if necessary, the need for more tests. Vaccines Your health care provider may recommend certain vaccines, such as:  Influenza vaccine. This is recommended every year.  Tetanus, diphtheria, and acellular pertussis (Tdap, Td) vaccine. You may need a Td booster every 10 years.  Varicella vaccine. You may need this if you have not been vaccinated.  Zoster vaccine. You may need this after age 63.  Measles, mumps, and rubella (MMR) vaccine. You may need at least one dose of MMR if you were born in 1957 or later.  You may also need a second dose.  Pneumococcal 13-valent conjugate (PCV13) vaccine. One dose is recommended after age 62.  Pneumococcal polysaccharide (PPSV23) vaccine. One dose is recommended after age 28.  Meningococcal vaccine. You may need this if you have certain conditions.  Hepatitis A vaccine. You may need this if you have certain conditions or if you travel or work  in places where you may be exposed to hepatitis A.  Hepatitis B vaccine. You may need this if you have certain conditions or if you travel or work in places where you may be exposed to hepatitis B.  Haemophilus influenzae type b (Hib) vaccine. You may need this if you have certain conditions.  Talk to your health care provider about which screenings and vaccines you need and how often you need them. This information is not intended to replace advice given to you by your health care provider. Make sure you discuss any questions you have with your health care provider. Document Released: 04/13/2015 Document Revised: 12/05/2015 Document Reviewed: 01/16/2015 Elsevier Interactive Patient Education  2017 [employer].

## 2017-02-24 NOTE — Progress Notes (Signed)
Pre visit review using our clinic review tool, if applicable. No additional management support is needed unless otherwise documented below in the visit note. 

## 2017-02-26 ENCOUNTER — Encounter: Payer: Self-pay | Admitting: *Deleted

## 2017-02-26 ENCOUNTER — Other Ambulatory Visit: Payer: Self-pay | Admitting: Physician Assistant

## 2017-02-26 ENCOUNTER — Telehealth: Payer: Self-pay | Admitting: *Deleted

## 2017-02-26 DIAGNOSIS — E039 Hypothyroidism, unspecified: Secondary | ICD-10-CM

## 2017-02-26 MED ORDER — LEVOTHYROXINE SODIUM 75 MCG PO TABS: 75.0000 ug | ORAL_TABLET | Freq: Every day | ORAL | 3 refills | Status: DC

## 2017-02-26 NOTE — Telephone Encounter (Signed)
Copied from CRM [id number]. Topic: General - Other >> Feb 26, 2017  1:52 PM Gerrianne ScalePayne, Angela L wrote: Reason for CRM: patient states that [facility] want fill her thyroid medicine unless Malva CoganCody Martin state which one she need to be on the Name Brand or Generic [facility] number is [phone]    Called and spoke to [facility]-pharmacist stated Medication was still being processed as the Brand name-covered on her plan.

## 2017-06-11 ENCOUNTER — Other Ambulatory Visit: Payer: Self-pay

## 2017-06-11 ENCOUNTER — Encounter: Payer: Self-pay | Admitting: Physician Assistant

## 2017-06-11 ENCOUNTER — Ambulatory Visit: Payer: BLUE CROSS/BLUE SHIELD | Admitting: Physician Assistant

## 2017-06-11 VITALS — BP 126/76 | HR 68 | Temp 98.6°F | Resp 16 | Ht 66.0 in | Wt 199.0 lb

## 2017-06-11 DIAGNOSIS — J069 Acute upper respiratory infection, unspecified: Secondary | ICD-10-CM | POA: Diagnosis not present

## 2017-06-11 DIAGNOSIS — B9789 Other viral agents as the cause of diseases classified elsewhere: Secondary | ICD-10-CM

## 2017-06-11 LAB — POC INFLUENZA A&B (BINAX/QUICKVUE)
INFLUENZA B, POC: NEGATIVE
Influenza A, POC: NEGATIVE

## 2017-06-11 MED ORDER — BENZONATATE 100 MG PO CAPS: 100.0000 mg | ORAL_CAPSULE | Freq: Three times a day (TID) | ORAL | 0 refills | Status: DC | PRN

## 2017-06-11 NOTE — Patient Instructions (Signed)
Symptoms and examination are consistent with a viral upper respiratory infection.   Please keep well-hydrated and get plenty of rest.  Use the Tessalon as directed to help with cough. Continue the Nyquil/Dayquil.  Put a humidifier in the bedroom. Start a saline nasal rinse.  Call me if symptoms are not improving.  Typically day 2-4 are the worst and then symptoms begin significantly improving.    Upper Respiratory Infection, Adult Most upper respiratory infections (URIs) are caused by a virus. A URI affects the nose, throat, and upper air passages. The most common type of URI is often called "the common cold." Follow these instructions at home:  Take medicines only as told by your doctor.  Gargle warm saltwater or take cough drops to comfort your throat as told by your doctor.  Use a warm mist humidifier or inhale steam from a shower to increase air moisture. This may make it easier to breathe.  Drink enough fluid to keep your pee (urine) clear or pale yellow.  Eat soups and other clear broths.  Have a healthy diet.  Rest as needed.  Go back to work when your fever is gone or your doctor says it is okay. ? You may need to stay home longer to avoid giving your URI to others. ? You can also wear a face mask and wash your hands often to prevent spread of the virus.  Use your inhaler more if you have asthma.  Do not use any tobacco products, including cigarettes, chewing tobacco, or electronic cigarettes. If you need help quitting, ask your doctor. Contact a doctor if:  You are getting worse, not better.  Your symptoms are not helped by medicine.  You have chills.  You are getting more short of breath.  You have brown or red mucus.  You have yellow or brown discharge from your nose.  You have pain in your face, especially when you bend forward.  You have a fever.  You have puffy (swollen) neck glands.  You have pain while swallowing.  You have white areas in the  back of your throat. Get help right away if:  You have very bad or constant: ? Headache. ? Ear pain. ? Pain in your forehead, behind your eyes, and over your cheekbones (sinus pain). ? Chest pain.  You have long-lasting (chronic) lung disease and any of the following: ? Wheezing. ? Long-lasting cough. ? Coughing up blood. ? A change in your usual mucus.  You have a stiff neck.  You have changes in your: ? Vision. ? Hearing. ? Thinking. ? Mood. This information is not intended to replace advice given to you by your health care provider. Make sure you discuss any questions you have with your health care provider. Document Released: 09/03/2007 Document Revised: 11/18/2015 Document Reviewed: 06/22/2013 Elsevier Interactive Patient Education  2018 [employer]

## 2017-06-11 NOTE — Progress Notes (Signed)
Patient presents to clinic today c/o 2 days of cough that is now productive of clear sputum, sore throat, raspy voice and chest congestion. Denies fever, chills. Endorses fatigue. Denies chest pain, sinus pain, ear pain or SOB. Denies recent travel. Has been very stressed after the sudden loss of her brother last week. Has been taking Nyquil/Dayquil to help with symptoms.   Past Medical History:  Diagnosis Date  . Anxiety   . Arthritis   . Asthma   . Chicken pox   . Cubital tunnel syndrome   . Diverticulitis   . Hyperlipidemia   . Hypertension   . 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 Palpitations   . Seasonal allergies   . Shoulder pain, left   . Thyroid disease     Current Outpatient Medications on File Prior to Visit  Medication Sig Dispense Refill  . ezetimibe (ZETIA) 10 MG tablet Take 1 tablet (10 mg total) by mouth daily. 90 tablet 3  . fluticasone (FLONASE) 50 MCG/ACT nasal spray Place 1 spray into both nostrils as needed for allergies or rhinitis.    Marland Kitchen. ibuprofen (ADVIL,MOTRIN) 200 MG tablet Take 400 mg by mouth every 6 (six) hours as needed for headache or moderate pain.    Marland Kitchen. ketotifen (ZADITOR) 0.025 % ophthalmic solution Place 1 drop into both eyes daily as needed (itching.).    Marland Kitchen. levothyroxine (SYNTHROID, LEVOTHROID) 75 MCG tablet Take 1 tablet (75 mcg total) by mouth daily before breakfast. 90 tablet 3  . loratadine (CLARITIN) 10 MG tablet Take 10 mg by mouth daily as needed for allergies or rhinitis.    . metoprolol tartrate (LOPRESSOR) 25 MG tablet TAKE ONE-HALF (1/2) TABLET TWICE A DAY 90 tablet 3  . simvastatin (ZOCOR) 40 MG tablet Take 1 tablet (40 mg total) by mouth at bedtime. 90 tablet 3   No current facility-administered medications on file prior to visit.     Allergies  Allergen Reactions  . Augmentin [Amoxicillin-Pot Clavulanate]     "gastric issues"  . Erythromycin     "Gastric issues"    Family History  Problem Relation  Age of Onset  . Heart Problems Father        cardiac stents  . Hyperlipidemia Father   . Arthritis-Osteo Father   . Heart Problems Mother        Palpitations  . Transient ischemic attack Mother   . Arthritis-Osteo Mother   . Memory loss Mother   . Dementia Mother   . Diabetes Brother   . Heart attack Brother   . Arthritis Sister   . Colon cancer Maternal Grandmother   . Heart disease Paternal Grandmother   . Heart disease Paternal Grandfather   . Gallbladder disease Brother   . Hypertension Other     Social History   Socioeconomic History  . Marital status: Married    Spouse name: None  . Number of children: None  . Years of education: None  . Highest education level: None  Social Needs  . Financial resource strain: None  . Food insecurity - worry: None  . Food insecurity - inability: None  . Transportation needs - medical: None  . Transportation needs - non-medical: None  Occupational History  . None  Tobacco Use  . Smoking status: Never Smoker  . Smokeless tobacco: Never Used  Substance and Sexual Activity  . Alcohol use: Yes    Comment: 1/2 bottle wine  . Drug use: No  .  Sexual activity: None  Other Topics Concern  . None  Social History Narrative  . None   Review of Systems - See HPI.  All other ROS are negative.  BP 126/76   Pulse 68   Temp 98.6 F (37 C) (Oral)   Resp 16   Ht 5\' 6"  (1.676 m)   Wt 199 lb (90.3 kg)   SpO2 97%   BMI 32.12 kg/m   Physical Exam  Constitutional: She is oriented to person, place, and time and well-developed, well-nourished, and in no distress.  HENT:  Head: Normocephalic and atraumatic.  Eyes: Conjunctivae are normal.  Neck: Neck supple.  Cardiovascular: Normal rate, regular rhythm, normal heart sounds and intact distal pulses.  Pulmonary/Chest: Effort normal and breath sounds normal. No respiratory distress. She has no wheezes. She has no rales. She exhibits no tenderness.  Neurological: She is alert and oriented  to person, place, and time.  Skin: Skin is warm and dry. No rash noted.  Psychiatric: Affect normal.  Vitals reviewed.  Assessment/Plan: 1. Viral URI with cough Flu swab negative. Supportive measures reviewed. OTC medications discussed. Rx Tessalon to help with cough. Follow-up if not improving over the next 3-4 days.  - POC Influenza A&B(BINAX/QUICKVUE)   Piedad Climes, PA-C

## 2017-10-10 ENCOUNTER — Other Ambulatory Visit: Payer: Self-pay | Admitting: Nurse Practitioner

## 2017-11-03 ENCOUNTER — Encounter: Payer: Self-pay | Admitting: Physician Assistant

## 2017-11-03 ENCOUNTER — Ambulatory Visit (INDEPENDENT_AMBULATORY_CARE_PROVIDER_SITE_OTHER): Payer: Medicare Other | Admitting: Physician Assistant

## 2017-11-03 ENCOUNTER — Other Ambulatory Visit: Payer: Self-pay

## 2017-11-03 VITALS — BP 98/60 | HR 44 | Temp 97.5°F | Resp 14 | Ht 66.0 in | Wt 172.0 lb

## 2017-11-03 DIAGNOSIS — R001 Bradycardia, unspecified: Secondary | ICD-10-CM | POA: Insufficient documentation

## 2017-11-03 DIAGNOSIS — E039 Hypothyroidism, unspecified: Secondary | ICD-10-CM

## 2017-11-03 HISTORY — DX: Bradycardia, unspecified: R00.1

## 2017-11-03 LAB — TSH: TSH: 1 u[IU]/mL (ref 0.35–4.50)

## 2017-11-03 MED ORDER — LEVOTHYROXINE SODIUM 75 MCG PO TABS: 75.0000 ug | ORAL_TABLET | Freq: Every day | ORAL | 3 refills | Status: DC

## 2017-11-03 NOTE — Progress Notes (Signed)
Patient presents to clinic today for follow-up of hypothyroidism. Patient is currently on a regimen of levothyroxine 75 mcg. Endorses taking daily as directed. Has also been doing Weight Watchers. Is walking daily. Is swimming a few times per week. Has lost > 10 pounds on this regimen.   Past Medical History:  Diagnosis Date  . Anxiety   . Arthritis   . Asthma   . Chicken pox   . Cubital tunnel syndrome   . Diverticulitis   . Hyperlipidemia   . Hypertension   . 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 Palpitations   . Seasonal allergies   . Shoulder pain, left   . Thyroid disease     Current Outpatient Medications on File Prior to Visit  Medication Sig Dispense Refill  . ezetimibe (ZETIA) 10 MG tablet TAKE 1 TABLET DAILY 90 tablet 3  . fluticasone (FLONASE) 50 MCG/ACT nasal spray Place 1 spray into both nostrils as needed for allergies or rhinitis.    Marland Kitchen ibuprofen (ADVIL,MOTRIN) 200 MG tablet Take 400 mg by mouth every 6 (six) hours as needed for headache or moderate pain.    Marland Kitchen ketotifen (ZADITOR) 0.025 % ophthalmic solution Place 1 drop into both eyes daily as needed (itching.).    Marland Kitchen levothyroxine (SYNTHROID, LEVOTHROID) 75 MCG tablet Take 1 tablet (75 mcg total) by mouth daily before breakfast. 90 tablet 3  . loratadine (CLARITIN) 10 MG tablet Take 10 mg by mouth daily as needed for allergies or rhinitis.    . metoprolol tartrate (LOPRESSOR) 25 MG tablet TAKE ONE-HALF (1/2) TABLET TWICE A DAY 90 tablet 3  . simvastatin (ZOCOR) 40 MG tablet Take 1 tablet (40 mg total) by mouth at bedtime. 90 tablet 3   No current facility-administered medications on file prior to visit.     Allergies  Allergen Reactions  . Augmentin [Amoxicillin-Pot Clavulanate]     "gastric issues"  . Erythromycin     "Gastric issues"    Family History  Problem Relation Age of Onset  . Heart Problems Father        cardiac stents  . Hyperlipidemia Father   . Arthritis-Osteo  Father   . Heart Problems Mother        Palpitations  . Transient ischemic attack Mother   . Arthritis-Osteo Mother   . Memory loss Mother   . Dementia Mother   . Diabetes Brother   . Heart attack Brother   . Arthritis Sister   . Colon cancer Maternal Grandmother   . Heart disease Paternal Grandmother   . Heart disease Paternal Grandfather   . Gallbladder disease Brother   . Hypertension Other     Social History   Socioeconomic History  . Marital status: Married    Spouse name: Not on file  . Number of children: Not on file  . Years of education: Not on file  . Highest education level: Not on file  Occupational History  . Occupation: Retired  Engineer, production  . Financial resource strain: Not on file  . Food insecurity:    Worry: Not on file    Inability: Not on file  . Transportation needs:    Medical: Not on file    Non-medical: Not on file  Tobacco Use  . Smoking status: Never Smoker  . Smokeless tobacco: Never Used  Substance and Sexual Activity  . Alcohol use: Yes    Comment: 1/2 bottle wine  . Drug use: No  .  Sexual activity: Yes    Birth control/protection: Surgical  Lifestyle  . Physical activity:    Days per week: Not on file    Minutes per session: Not on file  . Stress: Not on file  Relationships  . Social connections:    Talks on phone: Not on file    Gets together: Not on file    Attends religious service: Not on file    Active member of club or organization: Not on file    Attends meetings of clubs or organizations: Not on file    Relationship status: Not on file  Other Topics Concern  . Not on file  Social History Narrative  . Not on file   Review of Systems - See HPI.  All other ROS are negative.  BP 98/60   Pulse (!) 44   Temp (!) 97.5 F (36.4 C) (Oral)   Resp 14   Ht 5\' 6"  (1.676 m)   Wt 172 lb (78 kg)   SpO2 97%   BMI 27.76 kg/m   Physical Exam  Constitutional: She is oriented to person, place, and time. She appears  well-developed and well-nourished.  HENT:  Head: Normocephalic and atraumatic.  Eyes: Conjunctivae are normal.  Neck: Neck supple.  Cardiovascular: Regular rhythm and intact distal pulses.  Pulmonary/Chest: Effort normal.  Neurological: She is alert and oriented to person, place, and time.  Psychiatric: She has a normal mood and affect.  Vitals reviewed.  Assessment/Plan: Hypothyroidism Will repeat TSH levels today. Continue current regimen.   Bradycardia Asymptomatic. On Metoprolol 12.5 mg BID for HTN. BP low after weight loss. Recommend cutting back Metoprolol to 1 per day for 3 days before completely holding. Will notify Cardiologist in case they want any other changes.     Piedad ClimesWilliam Cody Ermelinda Eckert, PA-C

## 2017-11-03 NOTE — Assessment & Plan Note (Signed)
Will repeat TSH levels today. Continue current regimen.

## 2017-11-03 NOTE — Patient Instructions (Addendum)
Please go to the lab today for blood work.  I will call you with your results. We will alter treatment regimen(s) if indicated by your results.   I would recommend you take the Metoprolol once daily x 2 days before stopping completely. Check home BP. I am notifying Dr. Delton SeeNelson about this in case she wants to make any further changes.   Congratulations on your move!!!

## 2017-11-03 NOTE — Assessment & Plan Note (Signed)
Asymptomatic. On Metoprolol 12.5 mg BID for HTN. BP low after weight loss. Recommend cutting back Metoprolol to 1 per day for 3 days before completely holding. Will notify Cardiologist in case they want any other changes.

## 2017-12-03 ENCOUNTER — Encounter: Payer: Self-pay | Admitting: Cardiology

## 2017-12-03 ENCOUNTER — Ambulatory Visit (INDEPENDENT_AMBULATORY_CARE_PROVIDER_SITE_OTHER): Payer: Medicare Other | Admitting: Cardiology

## 2017-12-03 VITALS — BP 126/70 | HR 55 | Ht 66.0 in | Wt 169.8 lb

## 2017-12-03 DIAGNOSIS — R002 Palpitations: Secondary | ICD-10-CM | POA: Diagnosis not present

## 2017-12-03 DIAGNOSIS — I10 Essential (primary) hypertension: Secondary | ICD-10-CM

## 2017-12-03 DIAGNOSIS — Z8249 Family history of ischemic heart disease and other diseases of the circulatory system: Secondary | ICD-10-CM

## 2017-12-03 DIAGNOSIS — E785 Hyperlipidemia, unspecified: Secondary | ICD-10-CM | POA: Diagnosis not present

## 2017-12-03 LAB — COMPREHENSIVE METABOLIC PANEL
ALT: 27 IU/L (ref 0–32)
AST: 23 IU/L (ref 0–40)
Albumin/Globulin Ratio: 2.3 — ABNORMAL HIGH (ref 1.2–2.2)
Albumin: 4.4 g/dL (ref 3.6–4.8)
Alkaline Phosphatase: 62 IU/L (ref 39–117)
BUN/Creatinine Ratio: 13 (ref 12–28)
BUN: 12 mg/dL (ref 8–27)
Bilirubin Total: 1.9 mg/dL — ABNORMAL HIGH (ref 0.0–1.2)
CO2: 23 mmol/L (ref 20–29)
Calcium: 9.6 mg/dL (ref 8.7–10.3)
Chloride: 104 mmol/L (ref 96–106)
Creatinine, Ser: 0.91 mg/dL (ref 0.57–1.00)
GFR calc Af Amer: 77 mL/min/{1.73_m2} (ref >59)
GFR calc non Af Amer: 66 mL/min/{1.73_m2} (ref >59)
Globulin, Total: 1.9 g/dL (ref 1.5–4.5)
Glucose: 90 mg/dL (ref 65–99)
Potassium: 4.4 mmol/L (ref 3.5–5.2)
Sodium: 143 mmol/L (ref 134–144)
Total Protein: 6.3 g/dL (ref 6.0–8.5)

## 2017-12-03 LAB — TSH: TSH: 1.06 u[IU]/mL (ref 0.450–4.500)

## 2017-12-03 LAB — LIPID PANEL
Chol/HDL Ratio: 2.6 ratio (ref 0.0–4.4)
Cholesterol, Total: 120 mg/dL (ref 100–199)
HDL: 46 mg/dL (ref >39)
LDL Calculated: 55 mg/dL (ref 0–99)
Triglycerides: 93 mg/dL (ref 0–149)
VLDL Cholesterol Cal: 19 mg/dL (ref 5–40)

## 2017-12-03 MED ORDER — EZETIMIBE 10 MG PO TABS: 10.0000 mg | ORAL_TABLET | Freq: Every day | ORAL | 6 refills | Status: AC

## 2017-12-03 MED ORDER — SIMVASTATIN 40 MG PO TABS: 40.0000 mg | ORAL_TABLET | Freq: Every day | ORAL | 6 refills | Status: AC

## 2017-12-03 NOTE — Progress Notes (Addendum)
CARDIOLOGY OFFICE NOTE  Date:  12/03/2017    [name] Date of Birth: April 10, 1950 Medical Record #[id number]  PCP:  Brunetta Jeans, PA-C  Cardiologist:  Meda Coffee  Chief complain: 1 year follow up  History of Present Illness: Courtney Valenzuela is a 67 y.o. female who has been followed for hypothyroidism, hyperlipidemia and palpitations. She had a negative stress test in 2011, negative cath in 2004 and has been treated for hypercholesterolemia with Simvastatin/niacin combination. Past Holter monitor was completely normal. She was started on low dose metoprolol. 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  She also had a traumatic event earlier this year when her brother died of sudden cardiac death secondary to myocardial infarction at age of 59.  He was a smoker and alcoholic, interestingly his twin brother does not suffer of heart disease. As a result she has lost 40 pounds, she is walking daily and achieves 15,000 steps.  She is completely asymptomatic with that and denies any chest pain or shortness of breath.  She is cautious about her diet.  She is compliant with her medications and has no side effects.  Her primary care doctor discontinued metoprolol as her heart rate was in 40s.  The patient is very excited about moving to the [facility] in the next few weeks.  Past Medical History:  Diagnosis Date  . Anxiety   . Arthritis   . Asthma   . Bradycardia 11/03/2017  . Chicken pox   . Cubital tunnel syndrome   . 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 Diverticulitis   . 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 Hyperlipidemia   . Hypertension   . 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 Palpitations   . Screening for ischemic heart disease 03/06/2014  . Seasonal allergies   . Shoulder pain, left   . Thyroid disease     Past Surgical History:  Procedure Laterality Date  . ABDOMINAL HYSTERECTOMY      . BREAST BIOPSY    . KNEE SURGERY    . laproscopy     Abdominal Scar TRissue  . PROCTOSCOPY  07/24/2014   Procedure: PROCTOSCOPY;  Surgeon: Leighton Ruff, MD;  Location: [facility];  Service: General;;  . SHOULDER SURGERY    . THYROIDECTOMY, PARTIAL    . TONSILLECTOMY    . WISDOM TOOTH EXTRACTION    . WRIST SURGERY     Medications: Current Outpatient Medications  Medication Sig Dispense Refill  . ezetimibe (ZETIA) 10 MG tablet Take 1 tablet (10 mg total) by mouth daily. 90 tablet 6  . ibuprofen (ADVIL,MOTRIN) 200 MG tablet Take 400 mg by mouth every 6 (six) hours as needed for headache or moderate pain.    Marland Kitchen levothyroxine (SYNTHROID, LEVOTHROID) 75 MCG tablet Take 1 tablet (75 mcg total) by mouth daily before breakfast. 90 tablet 3  . loratadine (CLARITIN) 10 MG tablet Take 10 mg by mouth daily as needed for allergies or rhinitis.    Marland Kitchen simvastatin (ZOCOR) 40 MG tablet Take 1 tablet (40 mg total) by mouth at bedtime. 90 tablet 6   No current facility-administered medications for this visit.     Allergies: Allergies  Allergen Reactions  . Augmentin [Amoxicillin-Pot Clavulanate]     "gastric issues"  . Erythromycin     "Gastric issues"   Social History: The patient  reports that she has never smoked. She has  never used smokeless tobacco. She reports that she drinks alcohol. She reports that she does not use drugs.   Family History: The patient's family history includes Arthritis in her sister; Arthritis-Osteo in her father and mother; Colon cancer in her maternal grandmother; Dementia in her mother; Diabetes in her brother; Gallbladder disease in her brother; Heart Problems in her father and mother; Heart attack in her brother; Heart disease in her paternal grandfather and paternal grandmother; Hyperlipidemia in her father; Hypertension in her other; Memory loss in her mother; Transient ischemic attack in her mother.   Review of Systems: Please see the history of present illness.    Otherwise, the review of systems is positive for none.   All other systems are reviewed and negative.   Physical Exam: VS:  BP 126/70   Pulse (!) 55   Ht '5\' 6"'  (1.676 m)   Wt 169 lb 12.8 oz (77 kg)   SpO2 99%   BMI 27.41 kg/m  .  BMI Body mass index is 27.41 kg/m.  Wt Readings from Last 3 Encounters:  12/03/17 169 lb 12.8 oz (77 kg)  11/03/17 172 lb (78 kg)  06/11/17 199 lb (90.3 kg)    General: Pleasant. Obese female who is alert and in no acute distress.   HEENT: Normal.  Neck: Supple, no JVD, carotid bruits, or masses noted.  Cardiac: Regular rate and rhythm. No murmurs, rubs, or gallops. No edema.  Respiratory:  Lungs are clear to auscultation bilaterally with normal work of breathing.  GI: Soft and nontender.  MS: No deformity or atrophy. Gait and ROM intact.  Skin: Warm and dry. Color is normal.  Neuro:  Strength and sensation are intact and no gross focal deficits noted.  Psych: Alert, appropriate and with normal affect.  LABORATORY DATA:  EKG:  EKG is not ordered today.  Lab Results  Component Value Date   WBC 4.8 02/24/2017   HGB 14.5 02/24/2017   HCT 43.8 02/24/2017   PLT 181.0 02/24/2017   GLUCOSE 116 (H) 02/24/2017   CHOL 134 02/24/2017   TRIG 171.0 (H) 02/24/2017   HDL 37.90 (L) 02/24/2017   LDLCALC 61 02/24/2017   ALT 19 02/24/2017   AST 22 02/24/2017   NA 140 02/24/2017   K 4.6 02/24/2017   CL 106 02/24/2017   CREATININE 0.88 02/24/2017   BUN 16 02/24/2017   CO2 27 02/24/2017   TSH 1.00 11/03/2017   HGBA1C 5.8 (H) 07/18/2014    BNP (last 3 results) No results for input(s): BNP in the last 8760 hours.  ProBNP (last 3 results) No results for input(s): PROBNP in the last 8760 hours.   Other Studies Reviewed Today:  Echo Study Conclusions from 2014  - Left ventricle: The cavity size was normal. Wall thickness was normal. Systolic function was normal. The estimated ejection fraction was in the range of 60% to 65%. Wall motion was  normal; there were no regional wall motion abnormalities. Features are consistent with a pseudonormal left ventricular filling pattern, with concomitant abnormal relaxation and increased filling pressure (grade 2 diastolic dysfunction). - Aortic valve: There was no stenosis. - Mitral valve: No significant regurgitation. - Left atrium: The atrium was moderately dilated. - Right ventricle: The cavity size was normal. Systolic function was normal. - Pulmonary arteries: No complete TR doppler jet so unable to estimate PA systolic pressure. - Inferior vena cava: The vessel was normal in size; the respirophasic diameter changes were in the normal range (= 50%); findings are  consistent with normal central venous pressure. Impressions:  - Normal LV size and systolic function, EF 96-92%. Normal RV size and systolic function. No significant valvular abnormalities.    Assessment/Plan:  1. Hypertension -resolved with weight loss, she is congratulated on that.  2. HLD -currently on simvastatin and Zetia, LDL 62 and triglycerides 171 in November 2018, we will repeat lipids and CMP today, even if numbers are good I would continue the same management considering significant family history of premature coronary artery disease.  3. Palpitations -resolved  4. Obesity -resolved, she is congratulated on her efforts and encouraged to continue regular exercise and healthy diet.   Current medicines are reviewed with the patient today.  The patient does not have concerns regarding medicines other than what has been noted above.  The following changes have been made:  See above.  Labs/ tests ordered today include:   Orders Placed This Encounter  Procedures  . Comp Met (CMET)  . TSH  . Lipid Profile   Disposition: Follow-up in 1 year, the patient is moving to [facility], she is advised to find a cardiologist there, we will fax all information once she finds a new  doctor.  Signed: Ena Dawley, MD 12/03/2017 8:48 AM

## 2017-12-03 NOTE — Patient Instructions (Signed)
Medication Instructions:   Your physician recommends that you continue on your current medications as directed. Please refer to the Current Medication list given to you today.    Labwork:  TODAY--CMET, TSH, AND LIPIDS     Follow-Up:  Your physician wants you to follow-up in: ONE YEAR WITH DR NELSON You will receive a reminder letter in the mail two months in advance. If you don't receive a letter, please call our office to schedule the follow-up appointment.        If you need a refill on your cardiac medications before your next appointment, please call your pharmacy.   

## 2018-10-17 ENCOUNTER — Other Ambulatory Visit: Payer: Self-pay | Admitting: Physician Assistant

## 2018-10-17 DIAGNOSIS — E039 Hypothyroidism, unspecified: Secondary | ICD-10-CM

## 2018-12-10 ENCOUNTER — Telehealth: Payer: Self-pay | Admitting: Cardiology

## 2018-12-10 NOTE — Telephone Encounter (Signed)
[place] and [place] another cardiologist. Recall deleted.

## 2019-01-10 ENCOUNTER — Other Ambulatory Visit: Payer: Self-pay | Admitting: Cardiology

## 2019-01-10 DIAGNOSIS — E785 Hyperlipidemia, unspecified: Secondary | ICD-10-CM

## 2019-01-10 DIAGNOSIS — I10 Essential (primary) hypertension: Secondary | ICD-10-CM

## 2019-01-11 ENCOUNTER — Other Ambulatory Visit: Payer: Self-pay | Admitting: Physician Assistant

## 2019-01-11 DIAGNOSIS — E039 Hypothyroidism, unspecified: Secondary | ICD-10-CM

## 2019-01-12 ENCOUNTER — Encounter: Payer: Self-pay | Admitting: Emergency Medicine

## 2019-01-12 NOTE — Telephone Encounter (Signed)
Mychart message sent to patient to schedule an appointment.

## 2019-01-27 ENCOUNTER — Other Ambulatory Visit: Payer: Self-pay | Admitting: Physician Assistant

## 2019-01-27 DIAGNOSIS — E039 Hypothyroidism, unspecified: Secondary | ICD-10-CM

## 2020-07-30 ENCOUNTER — Telehealth: Payer: Self-pay

## 2020-07-30 NOTE — Telephone Encounter (Signed)
LVM advising patient to call back and schedule TOC.
